# Patient Record
Sex: Male | Born: 1935 | Race: White | Hispanic: No | Marital: Married | Smoking: Former smoker
Health system: Southern US, Community
[De-identification: ages and names within clinical notes are randomized; demographics above are authoritative.]

## PROBLEM LIST (undated history)

## (undated) DIAGNOSIS — Z923 Personal history of irradiation: Secondary | ICD-10-CM

## (undated) DIAGNOSIS — C3410 Malignant neoplasm of upper lobe, unspecified bronchus or lung: Principal | ICD-10-CM

## (undated) DIAGNOSIS — J449 Chronic obstructive pulmonary disease, unspecified: Secondary | ICD-10-CM

## (undated) DIAGNOSIS — C342 Malignant neoplasm of middle lobe, bronchus or lung: Secondary | ICD-10-CM

## (undated) DIAGNOSIS — C3430 Malignant neoplasm of lower lobe, unspecified bronchus or lung: Secondary | ICD-10-CM

## (undated) DIAGNOSIS — I10 Essential (primary) hypertension: Secondary | ICD-10-CM

## (undated) DIAGNOSIS — E119 Type 2 diabetes mellitus without complications: Secondary | ICD-10-CM

## (undated) HISTORY — PX: LUNG SURGERY: SHX703

## (undated) HISTORY — PX: KNEE SURGERY: SHX244

## (undated) HISTORY — DX: Personal history of irradiation: Z92.3

## (undated) HISTORY — DX: Type 2 diabetes mellitus without complications: E11.9

## (undated) HISTORY — DX: Malignant neoplasm of middle lobe, bronchus or lung: C34.2

## (undated) HISTORY — PX: PROSTATE SURGERY: SHX751

## (undated) HISTORY — DX: Malignant neoplasm of upper lobe, unspecified bronchus or lung: C34.10

## (undated) HISTORY — PX: OTHER SURGICAL HISTORY: SHX169

## (undated) HISTORY — DX: Malignant neoplasm of lower lobe, unspecified bronchus or lung: C34.30

## (undated) HISTORY — DX: Chronic obstructive pulmonary disease, unspecified: J44.9

## (undated) HISTORY — DX: Essential (primary) hypertension: I10

## (undated) HISTORY — PX: NEPHRECTOMY: SHX65

---

## 2000-02-24 ENCOUNTER — Encounter: Payer: Self-pay | Admitting: *Deleted

## 2000-02-24 ENCOUNTER — Encounter: Admission: RE | Admit: 2000-02-24 | Discharge: 2000-02-24 | Payer: Self-pay | Admitting: *Deleted

## 2000-03-21 ENCOUNTER — Ambulatory Visit (HOSPITAL_COMMUNITY): Admission: RE | Admit: 2000-03-21 | Discharge: 2000-03-21 | Payer: Self-pay | Admitting: *Deleted

## 2000-05-28 ENCOUNTER — Encounter: Payer: Self-pay | Admitting: *Deleted

## 2000-05-28 ENCOUNTER — Ambulatory Visit (HOSPITAL_COMMUNITY): Admission: RE | Admit: 2000-05-28 | Discharge: 2000-05-28 | Payer: Self-pay | Admitting: *Deleted

## 2000-06-12 ENCOUNTER — Encounter: Payer: Self-pay | Admitting: *Deleted

## 2000-06-12 ENCOUNTER — Ambulatory Visit (HOSPITAL_COMMUNITY): Admission: RE | Admit: 2000-06-12 | Discharge: 2000-06-12 | Payer: Self-pay | Admitting: *Deleted

## 2000-08-06 ENCOUNTER — Ambulatory Visit (HOSPITAL_COMMUNITY): Admission: RE | Admit: 2000-08-06 | Discharge: 2000-08-06 | Payer: Self-pay | Admitting: *Deleted

## 2000-08-06 ENCOUNTER — Encounter: Payer: Self-pay | Admitting: *Deleted

## 2000-09-18 ENCOUNTER — Encounter: Payer: Self-pay | Admitting: Gastroenterology

## 2000-09-18 ENCOUNTER — Ambulatory Visit (HOSPITAL_COMMUNITY): Admission: RE | Admit: 2000-09-18 | Discharge: 2000-09-18 | Payer: Self-pay | Admitting: Gastroenterology

## 2000-10-04 ENCOUNTER — Ambulatory Visit (HOSPITAL_COMMUNITY): Admission: RE | Admit: 2000-10-04 | Discharge: 2000-10-04 | Payer: Self-pay | Admitting: Gastroenterology

## 2000-10-04 ENCOUNTER — Encounter: Payer: Self-pay | Admitting: Gastroenterology

## 2000-10-23 ENCOUNTER — Encounter: Admission: RE | Admit: 2000-10-23 | Discharge: 2000-11-11 | Payer: Self-pay | Admitting: *Deleted

## 2000-10-25 ENCOUNTER — Ambulatory Visit (HOSPITAL_COMMUNITY): Admission: RE | Admit: 2000-10-25 | Discharge: 2000-10-25 | Payer: Self-pay | Admitting: Gastroenterology

## 2000-10-25 ENCOUNTER — Encounter: Payer: Self-pay | Admitting: Gastroenterology

## 2000-12-11 ENCOUNTER — Encounter: Payer: Self-pay | Admitting: *Deleted

## 2000-12-11 ENCOUNTER — Ambulatory Visit (HOSPITAL_COMMUNITY): Admission: RE | Admit: 2000-12-11 | Discharge: 2000-12-11 | Payer: Self-pay | Admitting: *Deleted

## 2001-05-26 ENCOUNTER — Encounter: Admission: RE | Admit: 2001-05-26 | Discharge: 2001-05-26 | Payer: Self-pay | Admitting: *Deleted

## 2001-05-26 ENCOUNTER — Encounter: Payer: Self-pay | Admitting: *Deleted

## 2001-09-24 ENCOUNTER — Encounter: Payer: Self-pay | Admitting: *Deleted

## 2001-09-24 ENCOUNTER — Encounter: Admission: RE | Admit: 2001-09-24 | Discharge: 2001-09-24 | Payer: Self-pay | Admitting: *Deleted

## 2002-06-15 ENCOUNTER — Encounter: Admission: RE | Admit: 2002-06-15 | Discharge: 2002-06-15 | Payer: Self-pay | Admitting: *Deleted

## 2002-06-15 ENCOUNTER — Encounter: Payer: Self-pay | Admitting: *Deleted

## 2002-06-19 ENCOUNTER — Encounter: Admission: RE | Admit: 2002-06-19 | Discharge: 2002-06-19 | Payer: Self-pay | Admitting: *Deleted

## 2002-06-19 ENCOUNTER — Encounter: Payer: Self-pay | Admitting: *Deleted

## 2002-07-21 ENCOUNTER — Encounter: Payer: Self-pay | Admitting: Neurosurgery

## 2002-07-23 ENCOUNTER — Inpatient Hospital Stay (HOSPITAL_COMMUNITY): Admission: RE | Admit: 2002-07-23 | Discharge: 2002-07-25 | Payer: Self-pay | Admitting: Neurosurgery

## 2002-07-23 ENCOUNTER — Encounter: Payer: Self-pay | Admitting: Neurosurgery

## 2002-09-28 ENCOUNTER — Encounter: Payer: Self-pay | Admitting: Gastroenterology

## 2002-09-28 ENCOUNTER — Ambulatory Visit (HOSPITAL_COMMUNITY): Admission: RE | Admit: 2002-09-28 | Discharge: 2002-09-28 | Payer: Self-pay | Admitting: Gastroenterology

## 2002-12-01 ENCOUNTER — Encounter: Payer: Self-pay | Admitting: Neurosurgery

## 2002-12-03 ENCOUNTER — Encounter: Payer: Self-pay | Admitting: Neurosurgery

## 2002-12-03 ENCOUNTER — Inpatient Hospital Stay (HOSPITAL_COMMUNITY): Admission: RE | Admit: 2002-12-03 | Discharge: 2002-12-05 | Payer: Self-pay | Admitting: Neurosurgery

## 2003-07-23 ENCOUNTER — Ambulatory Visit (HOSPITAL_COMMUNITY): Admission: RE | Admit: 2003-07-23 | Discharge: 2003-07-23 | Payer: Self-pay | Admitting: Neurosurgery

## 2003-08-04 ENCOUNTER — Ambulatory Visit (HOSPITAL_COMMUNITY): Admission: RE | Admit: 2003-08-04 | Discharge: 2003-08-04 | Payer: Self-pay | Admitting: Neurosurgery

## 2003-08-18 ENCOUNTER — Inpatient Hospital Stay (HOSPITAL_COMMUNITY): Admission: RE | Admit: 2003-08-18 | Discharge: 2003-08-19 | Payer: Self-pay | Admitting: Neurosurgery

## 2003-10-06 ENCOUNTER — Encounter (INDEPENDENT_AMBULATORY_CARE_PROVIDER_SITE_OTHER): Payer: Self-pay | Admitting: Specialist

## 2003-10-06 ENCOUNTER — Ambulatory Visit (HOSPITAL_COMMUNITY): Admission: RE | Admit: 2003-10-06 | Discharge: 2003-10-06 | Payer: Self-pay | Admitting: Thoracic Surgery

## 2003-10-18 ENCOUNTER — Encounter (INDEPENDENT_AMBULATORY_CARE_PROVIDER_SITE_OTHER): Payer: Self-pay | Admitting: *Deleted

## 2003-10-18 ENCOUNTER — Inpatient Hospital Stay (HOSPITAL_COMMUNITY): Admission: EM | Admit: 2003-10-18 | Discharge: 2003-10-26 | Payer: Self-pay | Admitting: Thoracic Surgery

## 2003-11-02 ENCOUNTER — Encounter: Admission: RE | Admit: 2003-11-02 | Discharge: 2003-11-02 | Payer: Self-pay | Admitting: Thoracic Surgery

## 2003-11-16 ENCOUNTER — Encounter: Admission: RE | Admit: 2003-11-16 | Discharge: 2003-11-16 | Payer: Self-pay | Admitting: Thoracic Surgery

## 2003-12-22 ENCOUNTER — Ambulatory Visit (HOSPITAL_COMMUNITY): Admission: RE | Admit: 2003-12-22 | Discharge: 2003-12-22 | Payer: Self-pay | Admitting: Oncology

## 2003-12-23 ENCOUNTER — Encounter: Admission: RE | Admit: 2003-12-23 | Discharge: 2003-12-23 | Payer: Self-pay | Admitting: Thoracic Surgery

## 2004-01-20 ENCOUNTER — Encounter: Admission: RE | Admit: 2004-01-20 | Discharge: 2004-01-20 | Payer: Self-pay | Admitting: Thoracic Surgery

## 2004-01-26 ENCOUNTER — Ambulatory Visit (HOSPITAL_COMMUNITY): Admission: RE | Admit: 2004-01-26 | Discharge: 2004-01-26 | Payer: Self-pay | Admitting: Neurosurgery

## 2004-03-01 ENCOUNTER — Encounter: Admission: RE | Admit: 2004-03-01 | Discharge: 2004-03-01 | Payer: Self-pay | Admitting: Neurosurgery

## 2004-03-17 ENCOUNTER — Encounter: Admission: RE | Admit: 2004-03-17 | Discharge: 2004-03-17 | Payer: Self-pay | Admitting: Neurosurgery

## 2004-03-21 ENCOUNTER — Encounter: Admission: RE | Admit: 2004-03-21 | Discharge: 2004-03-21 | Payer: Self-pay | Admitting: Thoracic Surgery

## 2004-04-04 ENCOUNTER — Encounter: Admission: RE | Admit: 2004-04-04 | Discharge: 2004-04-04 | Payer: Self-pay | Admitting: Neurosurgery

## 2004-04-19 ENCOUNTER — Ambulatory Visit (HOSPITAL_COMMUNITY): Admission: RE | Admit: 2004-04-19 | Discharge: 2004-04-19 | Payer: Self-pay | Admitting: Oncology

## 2004-06-14 ENCOUNTER — Encounter: Admission: RE | Admit: 2004-06-14 | Discharge: 2004-06-14 | Payer: Self-pay | Admitting: Thoracic Surgery

## 2004-08-15 ENCOUNTER — Ambulatory Visit: Payer: Self-pay | Admitting: Oncology

## 2004-08-22 ENCOUNTER — Ambulatory Visit (HOSPITAL_COMMUNITY): Admission: RE | Admit: 2004-08-22 | Discharge: 2004-08-22 | Payer: Self-pay | Admitting: Oncology

## 2004-10-18 ENCOUNTER — Encounter: Admission: RE | Admit: 2004-10-18 | Discharge: 2004-10-18 | Payer: Self-pay | Admitting: Thoracic Surgery

## 2004-11-07 ENCOUNTER — Ambulatory Visit: Payer: Self-pay | Admitting: Oncology

## 2004-11-07 ENCOUNTER — Ambulatory Visit (HOSPITAL_COMMUNITY): Admission: RE | Admit: 2004-11-07 | Discharge: 2004-11-07 | Payer: Self-pay | Admitting: Oncology

## 2005-02-28 ENCOUNTER — Ambulatory Visit: Payer: Self-pay | Admitting: Gastroenterology

## 2005-03-01 ENCOUNTER — Ambulatory Visit: Payer: Self-pay | Admitting: Gastroenterology

## 2005-03-01 ENCOUNTER — Ambulatory Visit (HOSPITAL_COMMUNITY): Admission: RE | Admit: 2005-03-01 | Discharge: 2005-03-01 | Payer: Self-pay | Admitting: Gastroenterology

## 2005-03-05 ENCOUNTER — Ambulatory Visit: Payer: Self-pay | Admitting: Oncology

## 2005-03-08 ENCOUNTER — Ambulatory Visit (HOSPITAL_COMMUNITY): Admission: RE | Admit: 2005-03-08 | Discharge: 2005-03-08 | Payer: Self-pay | Admitting: Oncology

## 2005-03-27 ENCOUNTER — Ambulatory Visit (HOSPITAL_COMMUNITY): Admission: RE | Admit: 2005-03-27 | Discharge: 2005-03-27 | Payer: Self-pay | Admitting: Gastroenterology

## 2005-03-30 ENCOUNTER — Ambulatory Visit: Payer: Self-pay | Admitting: Gastroenterology

## 2005-03-30 ENCOUNTER — Ambulatory Visit: Payer: Self-pay | Admitting: Cardiology

## 2005-03-30 ENCOUNTER — Inpatient Hospital Stay (HOSPITAL_COMMUNITY): Admission: AD | Admit: 2005-03-30 | Discharge: 2005-04-05 | Payer: Self-pay | Admitting: Gastroenterology

## 2005-04-13 ENCOUNTER — Ambulatory Visit: Payer: Self-pay | Admitting: Gastroenterology

## 2005-04-18 ENCOUNTER — Encounter: Admission: RE | Admit: 2005-04-18 | Discharge: 2005-04-18 | Payer: Self-pay | Admitting: Thoracic Surgery

## 2005-06-21 ENCOUNTER — Encounter: Admission: RE | Admit: 2005-06-21 | Discharge: 2005-06-21 | Payer: Self-pay | Admitting: Neurosurgery

## 2005-07-05 ENCOUNTER — Encounter: Admission: RE | Admit: 2005-07-05 | Discharge: 2005-07-05 | Payer: Self-pay | Admitting: Neurosurgery

## 2005-07-11 ENCOUNTER — Ambulatory Visit: Payer: Self-pay | Admitting: Gastroenterology

## 2005-08-07 ENCOUNTER — Encounter: Admission: RE | Admit: 2005-08-07 | Discharge: 2005-08-07 | Payer: Self-pay | Admitting: Neurosurgery

## 2005-08-28 ENCOUNTER — Ambulatory Visit: Payer: Self-pay | Admitting: Oncology

## 2005-08-30 ENCOUNTER — Ambulatory Visit (HOSPITAL_COMMUNITY): Admission: RE | Admit: 2005-08-30 | Discharge: 2005-08-30 | Payer: Self-pay | Admitting: Oncology

## 2005-10-24 ENCOUNTER — Encounter: Admission: RE | Admit: 2005-10-24 | Discharge: 2005-10-24 | Payer: Self-pay | Admitting: Thoracic Surgery

## 2005-11-23 ENCOUNTER — Ambulatory Visit (HOSPITAL_COMMUNITY): Admission: RE | Admit: 2005-11-23 | Discharge: 2005-11-23 | Payer: Self-pay | Admitting: Oncology

## 2005-11-30 ENCOUNTER — Ambulatory Visit: Payer: Self-pay | Admitting: Oncology

## 2006-03-06 ENCOUNTER — Encounter: Admission: RE | Admit: 2006-03-06 | Discharge: 2006-03-06 | Payer: Self-pay | Admitting: Thoracic Surgery

## 2006-03-15 ENCOUNTER — Ambulatory Visit: Payer: Self-pay | Admitting: Oncology

## 2006-03-19 ENCOUNTER — Encounter: Admission: RE | Admit: 2006-03-19 | Discharge: 2006-03-19 | Payer: Self-pay | Admitting: Family Medicine

## 2006-03-25 LAB — CBC WITH DIFFERENTIAL/PLATELET
Basophils Absolute: 0 10*3/uL (ref 0.0–0.1)
Eosinophils Absolute: 0.2 10*3/uL (ref 0.0–0.5)
HCT: 44.9 % (ref 38.7–49.9)
LYMPH%: 21.8 % (ref 14.0–48.0)
MCV: 85 fL (ref 81.6–98.0)
MONO#: 0.7 10*3/uL (ref 0.1–0.9)
MONO%: 10.4 % (ref 0.0–13.0)
NEUT#: 4.1 10*3/uL (ref 1.5–6.5)
NEUT%: 64.4 % (ref 40.0–75.0)
Platelets: 247 10*3/uL (ref 145–400)
RBC: 5.28 10*6/uL (ref 4.20–5.71)
WBC: 6.4 10*3/uL (ref 4.0–10.0)

## 2006-03-25 LAB — COMPREHENSIVE METABOLIC PANEL
Albumin: 4.2 g/dL (ref 3.5–5.2)
Alkaline Phosphatase: 53 U/L (ref 39–117)
BUN: 11 mg/dL (ref 6–23)
CO2: 30 mEq/L (ref 19–32)
Calcium: 9.6 mg/dL (ref 8.4–10.5)
Chloride: 94 mEq/L — ABNORMAL LOW (ref 96–112)
Glucose, Bld: 118 mg/dL — ABNORMAL HIGH (ref 70–99)
Potassium: 3.9 mEq/L (ref 3.5–5.3)
Sodium: 132 mEq/L — ABNORMAL LOW (ref 135–145)
Total Protein: 6.9 g/dL (ref 6.0–8.3)

## 2006-03-25 LAB — MORPHOLOGY: RBC Comments: NORMAL

## 2006-03-25 LAB — LACTATE DEHYDROGENASE: LDH: 140 U/L (ref 94–250)

## 2006-03-27 ENCOUNTER — Ambulatory Visit (HOSPITAL_COMMUNITY): Admission: RE | Admit: 2006-03-27 | Discharge: 2006-03-27 | Payer: Self-pay | Admitting: Oncology

## 2006-09-11 ENCOUNTER — Encounter: Admission: RE | Admit: 2006-09-11 | Discharge: 2006-09-11 | Payer: Self-pay | Admitting: Thoracic Surgery

## 2006-09-18 ENCOUNTER — Ambulatory Visit: Payer: Self-pay | Admitting: Oncology

## 2006-09-23 LAB — CBC WITH DIFFERENTIAL/PLATELET
BASO%: 0.6 % (ref 0.0–2.0)
Eosinophils Absolute: 0.3 10*3/uL (ref 0.0–0.5)
LYMPH%: 24.3 % (ref 14.0–48.0)
MCHC: 34.8 g/dL (ref 32.0–35.9)
MONO#: 0.6 10*3/uL (ref 0.1–0.9)
NEUT#: 3.7 10*3/uL (ref 1.5–6.5)
Platelets: 223 10*3/uL (ref 145–400)
RBC: 5.33 10*6/uL (ref 4.20–5.71)
RDW: 12.5 % (ref 11.2–14.6)
WBC: 6.1 10*3/uL (ref 4.0–10.0)

## 2006-09-23 LAB — LACTATE DEHYDROGENASE: LDH: 120 U/L (ref 94–250)

## 2006-09-23 LAB — COMPREHENSIVE METABOLIC PANEL
Albumin: 4 g/dL (ref 3.5–5.2)
Alkaline Phosphatase: 52 U/L (ref 39–117)
BUN: 15 mg/dL (ref 6–23)
CO2: 30 mEq/L (ref 19–32)
Glucose, Bld: 157 mg/dL — ABNORMAL HIGH (ref 70–99)
Sodium: 135 mEq/L (ref 135–145)
Total Bilirubin: 0.8 mg/dL (ref 0.3–1.2)
Total Protein: 6.9 g/dL (ref 6.0–8.3)

## 2006-09-30 ENCOUNTER — Ambulatory Visit (HOSPITAL_COMMUNITY): Admission: RE | Admit: 2006-09-30 | Discharge: 2006-09-30 | Payer: Self-pay | Admitting: Oncology

## 2007-02-19 ENCOUNTER — Encounter: Admission: RE | Admit: 2007-02-19 | Discharge: 2007-02-19 | Payer: Self-pay | Admitting: Thoracic Surgery

## 2007-02-19 ENCOUNTER — Ambulatory Visit: Payer: Self-pay | Admitting: Thoracic Surgery

## 2007-03-27 ENCOUNTER — Ambulatory Visit: Payer: Self-pay | Admitting: Oncology

## 2007-03-31 LAB — LACTATE DEHYDROGENASE: LDH: 136 U/L (ref 94–250)

## 2007-03-31 LAB — COMPREHENSIVE METABOLIC PANEL
ALT: 17 U/L (ref 0–53)
Albumin: 3.9 g/dL (ref 3.5–5.2)
CO2: 30 mEq/L (ref 19–32)
Calcium: 9.2 mg/dL (ref 8.4–10.5)
Chloride: 94 mEq/L — ABNORMAL LOW (ref 96–112)
Glucose, Bld: 104 mg/dL — ABNORMAL HIGH (ref 70–99)
Sodium: 133 mEq/L — ABNORMAL LOW (ref 135–145)
Total Protein: 6.5 g/dL (ref 6.0–8.3)

## 2007-03-31 LAB — CBC WITH DIFFERENTIAL/PLATELET
BASO%: 0.3 % (ref 0.0–2.0)
Eosinophils Absolute: 0.2 10*3/uL (ref 0.0–0.5)
HCT: 44.3 % (ref 38.7–49.9)
MCHC: 35.9 g/dL (ref 32.0–35.9)
MONO#: 0.6 10*3/uL (ref 0.1–0.9)
NEUT#: 3.9 10*3/uL (ref 1.5–6.5)
Platelets: 211 10*3/uL (ref 145–400)
RBC: 5.09 10*6/uL (ref 4.20–5.71)
WBC: 6.5 10*3/uL (ref 4.0–10.0)
lymph#: 1.7 10*3/uL (ref 0.9–3.3)

## 2007-04-03 ENCOUNTER — Ambulatory Visit (HOSPITAL_COMMUNITY): Admission: RE | Admit: 2007-04-03 | Discharge: 2007-04-03 | Payer: Self-pay | Admitting: Oncology

## 2007-09-26 ENCOUNTER — Ambulatory Visit: Payer: Self-pay | Admitting: Oncology

## 2007-09-30 ENCOUNTER — Ambulatory Visit (HOSPITAL_COMMUNITY): Admission: RE | Admit: 2007-09-30 | Discharge: 2007-09-30 | Payer: Self-pay | Admitting: Oncology

## 2007-09-30 LAB — CBC WITH DIFFERENTIAL/PLATELET
Basophils Absolute: 0 10*3/uL (ref 0.0–0.1)
Eosinophils Absolute: 0.2 10*3/uL (ref 0.0–0.5)
HCT: 46.9 % (ref 38.7–49.9)
HGB: 16.6 g/dL (ref 13.0–17.1)
MCV: 87.8 fL (ref 81.6–98.0)
MONO%: 11.1 % (ref 0.0–13.0)
NEUT#: 3.7 10*3/uL (ref 1.5–6.5)
NEUT%: 60.6 % (ref 40.0–75.0)
Platelets: 219 10*3/uL (ref 145–400)
RDW: 13.2 % (ref 11.2–14.6)

## 2007-09-30 LAB — COMPREHENSIVE METABOLIC PANEL
Albumin: 3.7 g/dL (ref 3.5–5.2)
Alkaline Phosphatase: 50 U/L (ref 39–117)
BUN: 7 mg/dL (ref 6–23)
Calcium: 9.4 mg/dL (ref 8.4–10.5)
Chloride: 94 mEq/L — ABNORMAL LOW (ref 96–112)
Glucose, Bld: 110 mg/dL — ABNORMAL HIGH (ref 70–99)
Potassium: 4.2 mEq/L (ref 3.5–5.3)

## 2008-01-23 ENCOUNTER — Ambulatory Visit: Payer: Self-pay | Admitting: Oncology

## 2008-01-27 ENCOUNTER — Ambulatory Visit (HOSPITAL_COMMUNITY): Admission: RE | Admit: 2008-01-27 | Discharge: 2008-01-27 | Payer: Self-pay | Admitting: Oncology

## 2008-01-27 LAB — BASIC METABOLIC PANEL
BUN: 5 mg/dL — ABNORMAL LOW (ref 6–23)
Chloride: 88 mEq/L — ABNORMAL LOW (ref 96–112)
Potassium: 3.4 mEq/L — ABNORMAL LOW (ref 3.5–5.3)
Sodium: 128 mEq/L — ABNORMAL LOW (ref 135–145)

## 2008-02-03 ENCOUNTER — Encounter: Payer: Self-pay | Admitting: Gastroenterology

## 2008-02-05 ENCOUNTER — Ambulatory Visit (HOSPITAL_COMMUNITY): Admission: RE | Admit: 2008-02-05 | Discharge: 2008-02-05 | Payer: Self-pay | Admitting: Oncology

## 2008-02-10 ENCOUNTER — Ambulatory Visit: Payer: Self-pay | Admitting: Thoracic Surgery

## 2008-02-24 ENCOUNTER — Encounter: Payer: Self-pay | Admitting: Thoracic Surgery

## 2008-02-24 ENCOUNTER — Inpatient Hospital Stay (HOSPITAL_COMMUNITY): Admission: RE | Admit: 2008-02-24 | Discharge: 2008-03-03 | Payer: Self-pay | Admitting: Thoracic Surgery

## 2008-02-24 ENCOUNTER — Ambulatory Visit: Payer: Self-pay | Admitting: Thoracic Surgery

## 2008-03-07 ENCOUNTER — Ambulatory Visit: Payer: Self-pay | Admitting: Oncology

## 2008-03-07 ENCOUNTER — Inpatient Hospital Stay (HOSPITAL_COMMUNITY): Admission: EM | Admit: 2008-03-07 | Discharge: 2008-03-17 | Payer: Self-pay | Admitting: Thoracic Surgery

## 2008-03-07 ENCOUNTER — Ambulatory Visit: Payer: Self-pay | Admitting: Emergency Medicine

## 2008-03-07 ENCOUNTER — Encounter: Payer: Self-pay | Admitting: Emergency Medicine

## 2008-03-12 ENCOUNTER — Ambulatory Visit: Payer: Self-pay | Admitting: Oncology

## 2008-03-23 ENCOUNTER — Encounter: Admission: RE | Admit: 2008-03-23 | Discharge: 2008-03-23 | Payer: Self-pay | Admitting: Thoracic Surgery

## 2008-03-23 ENCOUNTER — Ambulatory Visit: Payer: Self-pay | Admitting: Thoracic Surgery

## 2008-03-25 ENCOUNTER — Ambulatory Visit: Payer: Self-pay | Admitting: Thoracic Surgery

## 2008-03-25 ENCOUNTER — Encounter: Admission: RE | Admit: 2008-03-25 | Discharge: 2008-03-25 | Payer: Self-pay | Admitting: Thoracic Surgery

## 2008-03-30 ENCOUNTER — Ambulatory Visit: Payer: Self-pay | Admitting: Thoracic Surgery

## 2008-03-30 ENCOUNTER — Encounter: Admission: RE | Admit: 2008-03-30 | Discharge: 2008-03-30 | Payer: Self-pay | Admitting: Thoracic Surgery

## 2008-04-07 LAB — CBC WITH DIFFERENTIAL/PLATELET
Basophils Absolute: 0 10*3/uL (ref 0.0–0.1)
Eosinophils Absolute: 0.5 10*3/uL (ref 0.0–0.5)
HGB: 11.6 g/dL — ABNORMAL LOW (ref 13.0–17.1)
MCV: 83.6 fL (ref 81.6–98.0)
MONO#: 0.9 10*3/uL (ref 0.1–0.9)
MONO%: 10.1 % (ref 0.0–13.0)
NEUT#: 6.3 10*3/uL (ref 1.5–6.5)
RBC: 3.96 10*6/uL — ABNORMAL LOW (ref 4.20–5.71)
RDW: 14.2 % (ref 11.2–14.6)
WBC: 9.3 10*3/uL (ref 4.0–10.0)
lymph#: 1.5 10*3/uL (ref 0.9–3.3)

## 2008-04-07 LAB — COMPREHENSIVE METABOLIC PANEL
Albumin: 3.5 g/dL (ref 3.5–5.2)
Alkaline Phosphatase: 81 U/L (ref 39–117)
Calcium: 9.3 mg/dL (ref 8.4–10.5)
Chloride: 96 mEq/L (ref 96–112)
Glucose, Bld: 95 mg/dL (ref 70–99)
Potassium: 4.6 mEq/L (ref 3.5–5.3)
Sodium: 133 mEq/L — ABNORMAL LOW (ref 135–145)
Total Protein: 7.3 g/dL (ref 6.0–8.3)

## 2008-04-13 ENCOUNTER — Ambulatory Visit: Payer: Self-pay | Admitting: Thoracic Surgery

## 2008-04-13 ENCOUNTER — Encounter: Admission: RE | Admit: 2008-04-13 | Discharge: 2008-04-13 | Payer: Self-pay | Admitting: Thoracic Surgery

## 2008-05-25 ENCOUNTER — Ambulatory Visit: Payer: Self-pay | Admitting: Thoracic Surgery

## 2008-05-25 ENCOUNTER — Encounter: Admission: RE | Admit: 2008-05-25 | Discharge: 2008-05-25 | Payer: Self-pay | Admitting: Thoracic Surgery

## 2008-07-01 ENCOUNTER — Ambulatory Visit: Payer: Self-pay | Admitting: Oncology

## 2008-07-05 ENCOUNTER — Ambulatory Visit (HOSPITAL_COMMUNITY): Admission: RE | Admit: 2008-07-05 | Discharge: 2008-07-05 | Payer: Self-pay | Admitting: Oncology

## 2008-07-05 LAB — CBC WITH DIFFERENTIAL/PLATELET
Basophils Absolute: 0 10*3/uL (ref 0.0–0.1)
Eosinophils Absolute: 0.3 10*3/uL (ref 0.0–0.5)
HGB: 13.8 g/dL (ref 13.0–17.1)
MCV: 82.1 fL (ref 81.6–98.0)
MONO#: 0.6 10*3/uL (ref 0.1–0.9)
MONO%: 9.6 % (ref 0.0–13.0)
NEUT#: 3.4 10*3/uL (ref 1.5–6.5)
Platelets: 210 10*3/uL (ref 145–400)
RBC: 4.73 10*6/uL (ref 4.20–5.71)
RDW: 14.1 % (ref 11.2–14.6)
WBC: 6 10*3/uL (ref 4.0–10.0)

## 2008-07-05 LAB — COMPREHENSIVE METABOLIC PANEL
Albumin: 3.9 g/dL (ref 3.5–5.2)
BUN: 13 mg/dL (ref 6–23)
CO2: 25 mEq/L (ref 19–32)
Calcium: 9.5 mg/dL (ref 8.4–10.5)
Glucose, Bld: 110 mg/dL — ABNORMAL HIGH (ref 70–99)
Potassium: 4.2 mEq/L (ref 3.5–5.3)
Sodium: 137 mEq/L (ref 135–145)
Total Protein: 6.6 g/dL (ref 6.0–8.3)

## 2008-07-28 ENCOUNTER — Encounter: Admission: RE | Admit: 2008-07-28 | Discharge: 2008-07-28 | Payer: Self-pay | Admitting: Thoracic Surgery

## 2008-07-28 ENCOUNTER — Ambulatory Visit: Payer: Self-pay | Admitting: Thoracic Surgery

## 2008-09-27 ENCOUNTER — Ambulatory Visit: Payer: Self-pay | Admitting: Oncology

## 2008-09-29 ENCOUNTER — Ambulatory Visit (HOSPITAL_COMMUNITY): Admission: RE | Admit: 2008-09-29 | Discharge: 2008-09-29 | Payer: Self-pay | Admitting: Oncology

## 2008-09-29 LAB — COMPREHENSIVE METABOLIC PANEL
Alkaline Phosphatase: 57 U/L (ref 39–117)
BUN: 13 mg/dL (ref 6–23)
CO2: 31 mEq/L (ref 19–32)
Creatinine, Ser: 1.05 mg/dL (ref 0.40–1.50)
Glucose, Bld: 109 mg/dL — ABNORMAL HIGH (ref 70–99)
Total Bilirubin: 1 mg/dL (ref 0.3–1.2)

## 2008-09-29 LAB — CBC WITH DIFFERENTIAL/PLATELET
Eosinophils Absolute: 0.3 10*3/uL (ref 0.0–0.5)
HCT: 45.1 % (ref 38.7–49.9)
LYMPH%: 24.8 % (ref 14.0–48.0)
MCV: 85.5 fL (ref 81.6–98.0)
MONO#: 0.7 10*3/uL (ref 0.1–0.9)
MONO%: 8.4 % (ref 0.0–13.0)
NEUT#: 5 10*3/uL (ref 1.5–6.5)
NEUT%: 63.2 % (ref 40.0–75.0)
Platelets: 233 10*3/uL (ref 145–400)
WBC: 7.9 10*3/uL (ref 4.0–10.0)

## 2008-09-29 LAB — LACTATE DEHYDROGENASE: LDH: 122 U/L (ref 94–250)

## 2008-10-06 ENCOUNTER — Ambulatory Visit: Payer: Self-pay | Admitting: Thoracic Surgery

## 2008-10-06 ENCOUNTER — Encounter: Payer: Self-pay | Admitting: Gastroenterology

## 2009-02-15 ENCOUNTER — Encounter: Admission: RE | Admit: 2009-02-15 | Discharge: 2009-02-15 | Payer: Self-pay | Admitting: Thoracic Surgery

## 2009-02-15 ENCOUNTER — Ambulatory Visit: Payer: Self-pay | Admitting: Thoracic Surgery

## 2009-03-25 ENCOUNTER — Ambulatory Visit: Payer: Self-pay | Admitting: Oncology

## 2009-03-29 ENCOUNTER — Ambulatory Visit (HOSPITAL_COMMUNITY): Admission: RE | Admit: 2009-03-29 | Discharge: 2009-03-29 | Payer: Self-pay | Admitting: Oncology

## 2009-03-29 LAB — CBC WITH DIFFERENTIAL/PLATELET
EOS%: 4.7 % (ref 0.0–7.0)
Eosinophils Absolute: 0.3 10*3/uL (ref 0.0–0.5)
LYMPH%: 30.8 % (ref 14.0–49.0)
MCH: 30.6 pg (ref 27.2–33.4)
MCV: 86.9 fL (ref 79.3–98.0)
MONO%: 11.6 % (ref 0.0–14.0)
Platelets: 190 10*3/uL (ref 140–400)
RBC: 5.03 10*6/uL (ref 4.20–5.82)
RDW: 13 % (ref 11.0–14.6)

## 2009-03-29 LAB — COMPREHENSIVE METABOLIC PANEL
AST: 24 U/L (ref 0–37)
Albumin: 3.6 g/dL (ref 3.5–5.2)
Alkaline Phosphatase: 45 U/L (ref 39–117)
BUN: 13 mg/dL (ref 6–23)
Glucose, Bld: 113 mg/dL — ABNORMAL HIGH (ref 70–99)
Potassium: 4.4 mEq/L (ref 3.5–5.3)
Sodium: 139 mEq/L (ref 135–145)
Total Bilirubin: 0.9 mg/dL (ref 0.3–1.2)
Total Protein: 6.6 g/dL (ref 6.0–8.3)

## 2009-04-06 ENCOUNTER — Ambulatory Visit: Payer: Self-pay | Admitting: Thoracic Surgery

## 2009-09-26 ENCOUNTER — Ambulatory Visit: Payer: Self-pay | Admitting: Oncology

## 2009-09-28 ENCOUNTER — Ambulatory Visit (HOSPITAL_COMMUNITY): Admission: RE | Admit: 2009-09-28 | Discharge: 2009-09-28 | Payer: Self-pay | Admitting: Oncology

## 2009-09-28 LAB — CBC WITH DIFFERENTIAL/PLATELET
Basophils Absolute: 0 10*3/uL (ref 0.0–0.1)
EOS%: 2.4 % (ref 0.0–7.0)
HCT: 46.2 % (ref 38.4–49.9)
HGB: 15.9 g/dL (ref 13.0–17.1)
LYMPH%: 27.1 % (ref 14.0–49.0)
MCH: 31 pg (ref 27.2–33.4)
MCHC: 34.4 g/dL (ref 32.0–36.0)
MCV: 90.1 fL (ref 79.3–98.0)
MONO%: 9.2 % (ref 0.0–14.0)
NEUT%: 61 % (ref 39.0–75.0)
Platelets: 205 10*3/uL (ref 140–400)
lymph#: 1.8 10*3/uL (ref 0.9–3.3)

## 2009-09-28 LAB — COMPREHENSIVE METABOLIC PANEL
AST: 22 U/L (ref 0–37)
BUN: 9 mg/dL (ref 6–23)
Calcium: 9 mg/dL (ref 8.4–10.5)
Chloride: 102 mEq/L (ref 96–112)
Creatinine, Ser: 1.1 mg/dL (ref 0.40–1.50)
Total Bilirubin: 0.8 mg/dL (ref 0.3–1.2)

## 2009-10-05 ENCOUNTER — Ambulatory Visit: Payer: Self-pay | Admitting: Thoracic Surgery

## 2009-11-07 IMAGING — CR DG CHEST 1V PORT
1 series · 1 of 1 positions shown · non-contrast
Comparison: 02/24/2008

CLINICAL DATA: Thoracotomy for right lower lobe mass.

PORTABLE CHEST - 1 VIEW

[view not recorded]
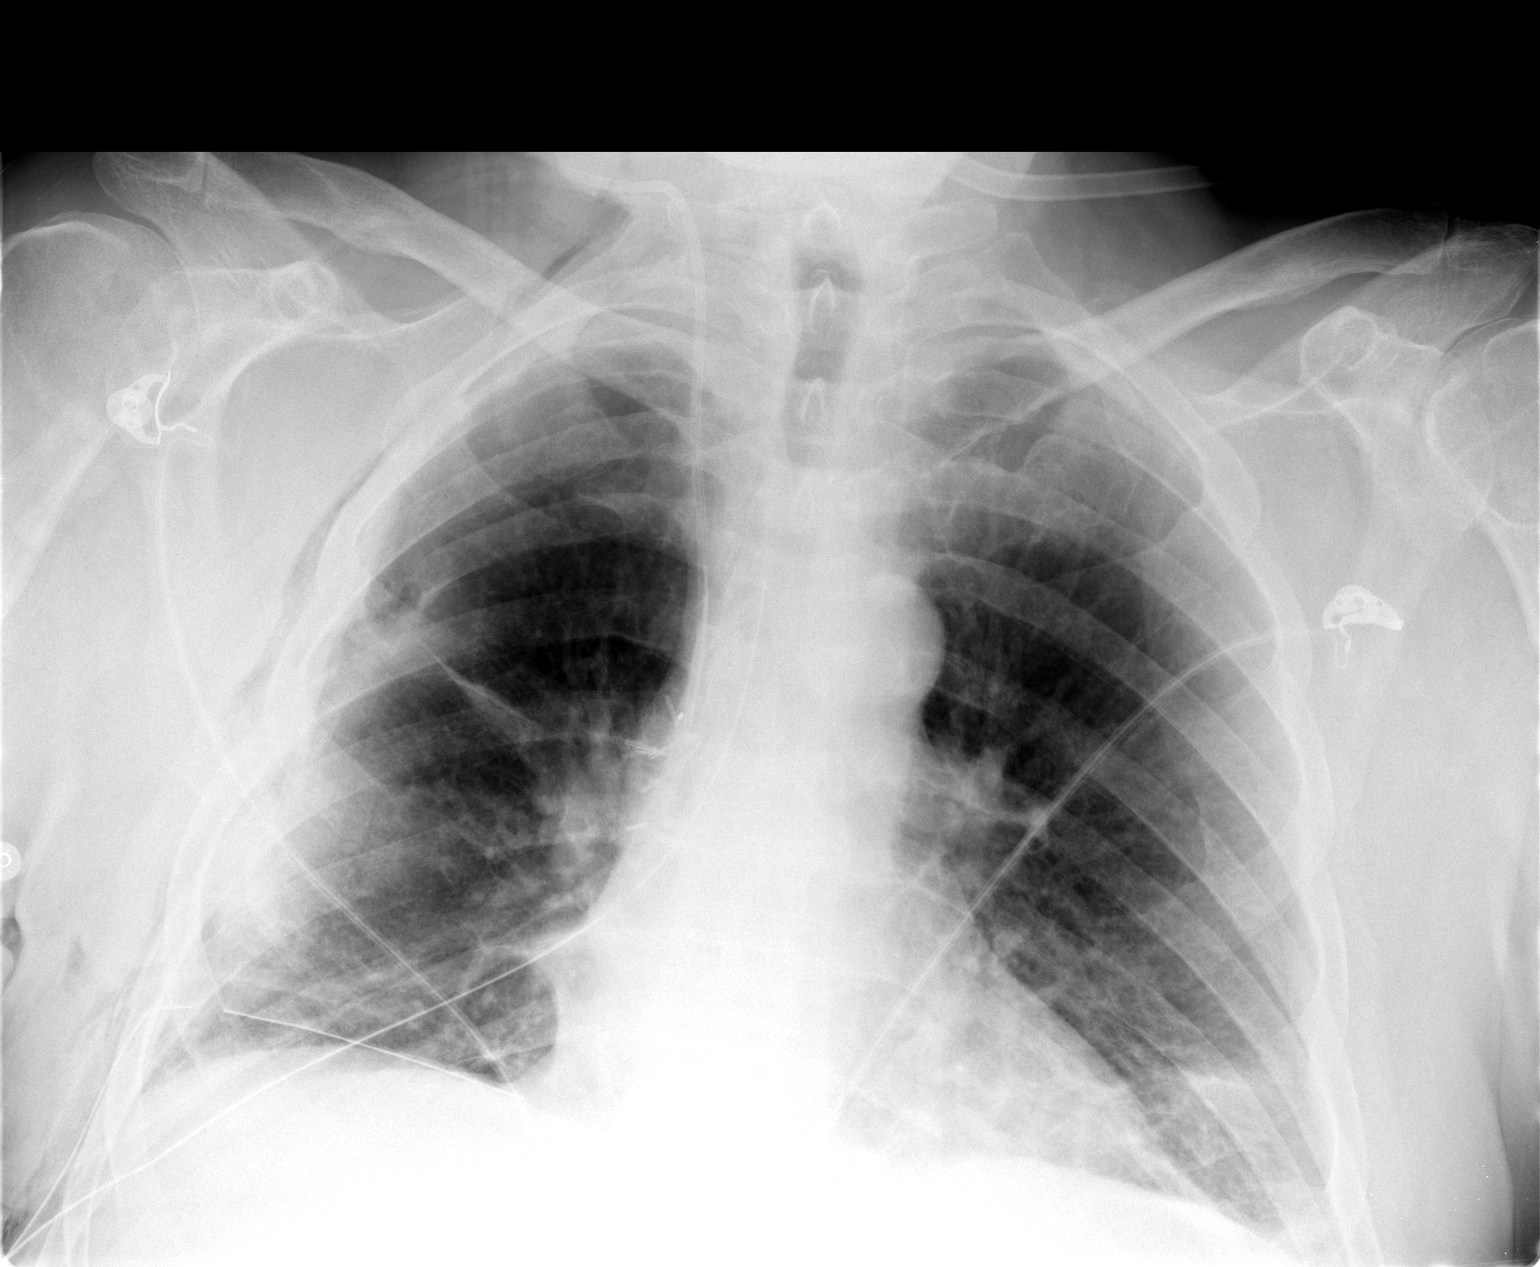

[1 of 1 positions shown; findings below may reference images not displayed]

FINDINGS: There has  been a right-sided thoracotomy.  Two chest
tubes are  present on the right.  There is a 5-10% pneumothorax on
the right in the lateral chest.  There is no significant effusion.
Patchy bibasilar atelectasis is present.

Central line tip is in the SVC.
IMPRESSION: 5-10% pneumothorax on the right following thoracotomy

Bibasilar atelectasis.

## 2009-11-08 IMAGING — CR DG CHEST 1V PORT
1 series · 1 of 1 positions shown · non-contrast
Comparison: Portable chest of 02/24/2008

CLINICAL DATA: Follow up VATS for right lung mass

PORTABLE CHEST - 1 VIEW

[view not recorded]
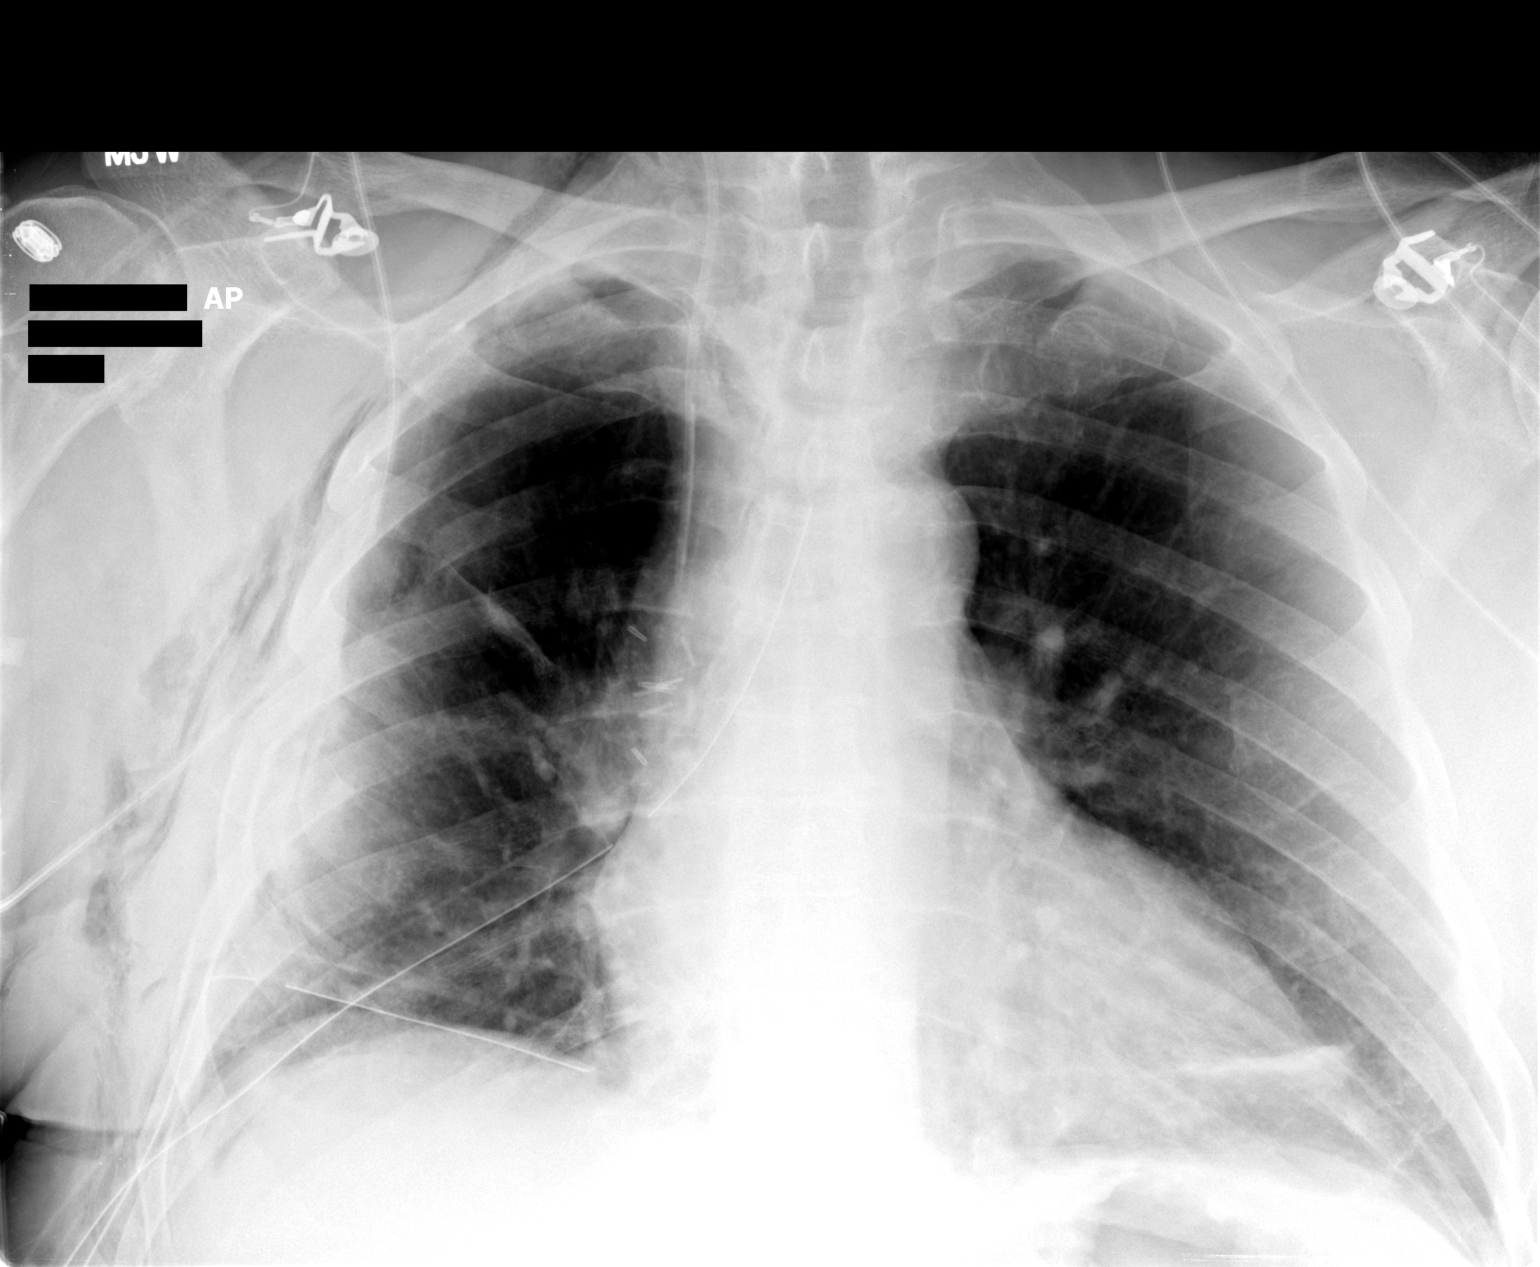

[1 of 1 positions shown; findings below may reference images not displayed]

FINDINGS: Two right chest tubes and a right central venous line
remain.  No definite pneumothorax is seen.  Right chest wall
subcutaneous air remains.  Linear atelectasis is noted at the left
lung base.
IMPRESSION: Two right chest tubes remain.  No definite pneumothorax.  No change
in right chest wall subcutaneous air.

## 2009-11-09 IMAGING — CR DG CHEST 1V PORT
1 series · 1 of 1 positions shown · non-contrast
Comparison: 02/25/2008.

CLINICAL DATA: Right lower lobe mass.  Postop VATS.

PORTABLE CHEST - 1 VIEW

[view not recorded]
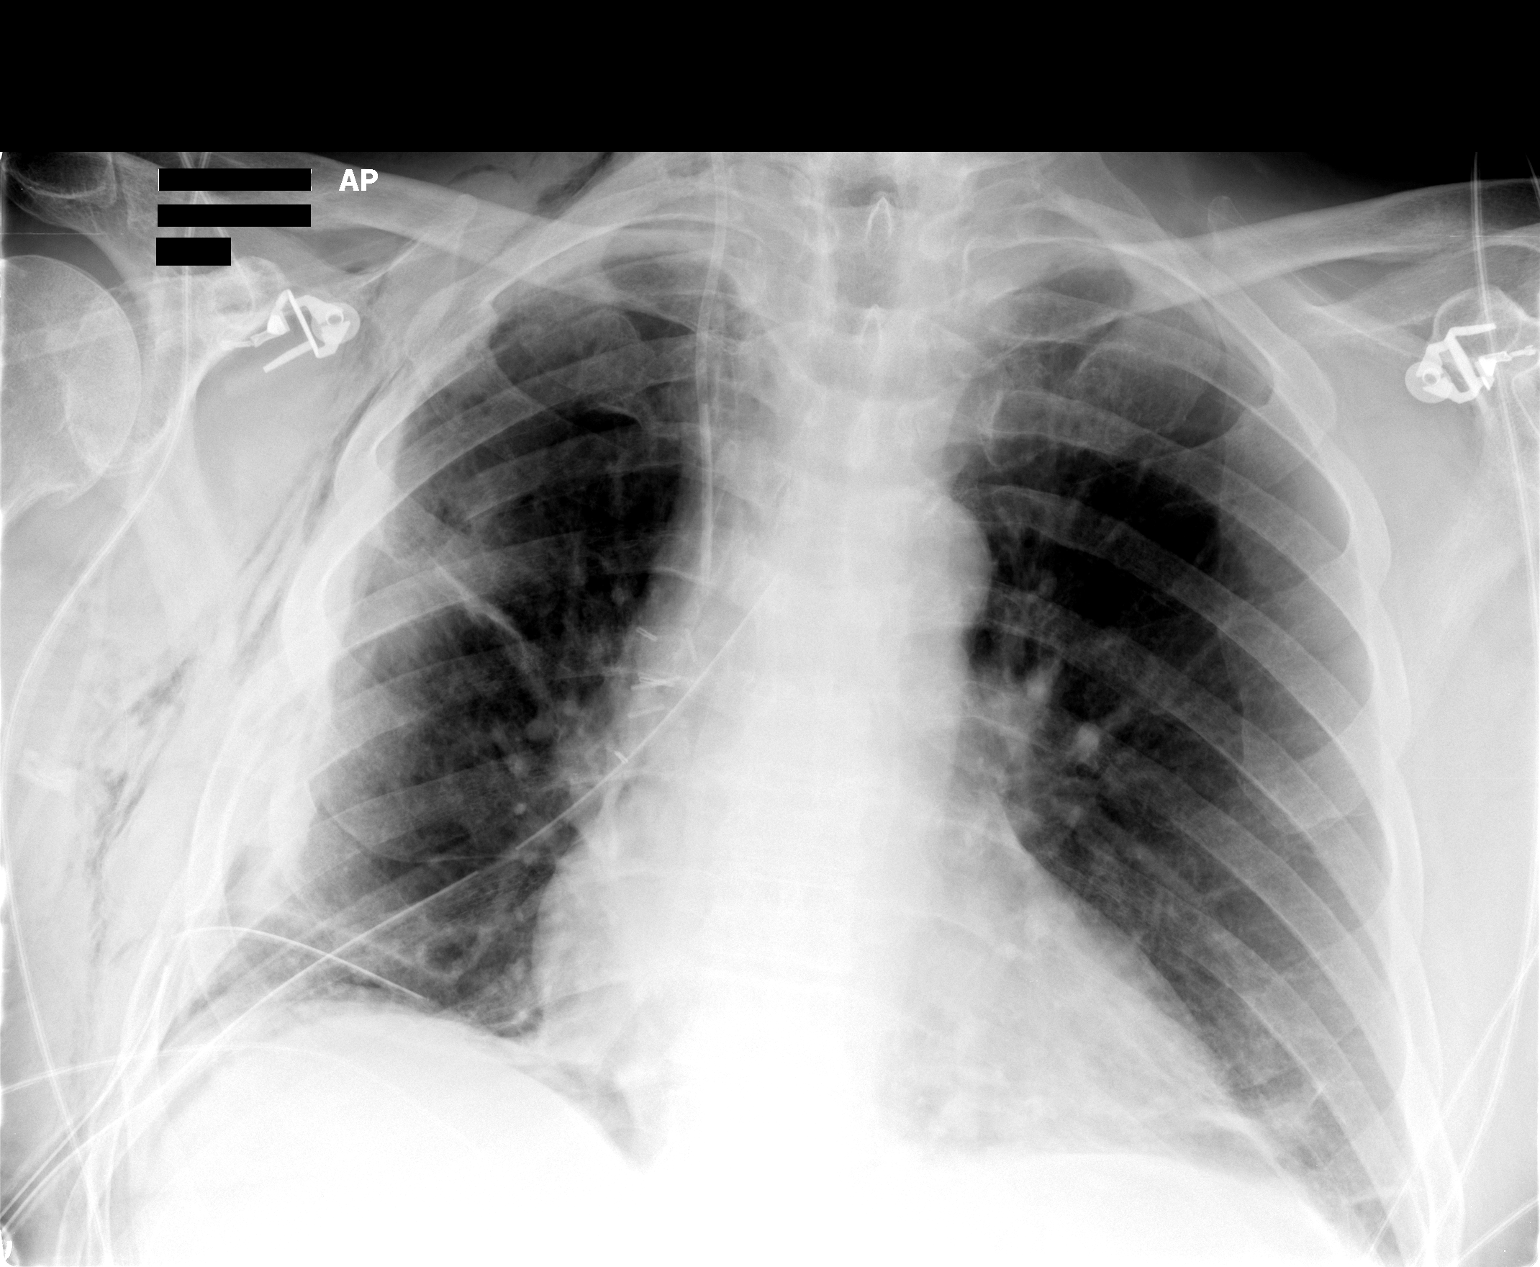

[1 of 1 positions shown; findings below may reference images not displayed]

FINDINGS: 6060 hours.  Central line and two right sided chest tubes
remain in place.  One of the chest tube's side port is external to
the pleural space.  No apical pneumothorax is demonstrated.  There
is a possible small basilar pneumothorax medially . There is
pleural thickening or loculated pleural fluid on the right and soft
tissue emphysema throughout the right chest wall.  Mild basilar
atelectasis appears stable.  The heart size and mediastinal
contours are unchanged.
IMPRESSION: Possible small medial basilar pneumothorax on the right.  Stable
basilar atelectasis.

## 2009-11-10 IMAGING — CR DG CHEST 1V PORT
1 series · 1 of 1 positions shown · non-contrast
Comparison: 02/26/2008

CLINICAL DATA: Right lower lobe mass.  Status post VATS.

PORTABLE CHEST - 1 VIEW

[view not recorded]
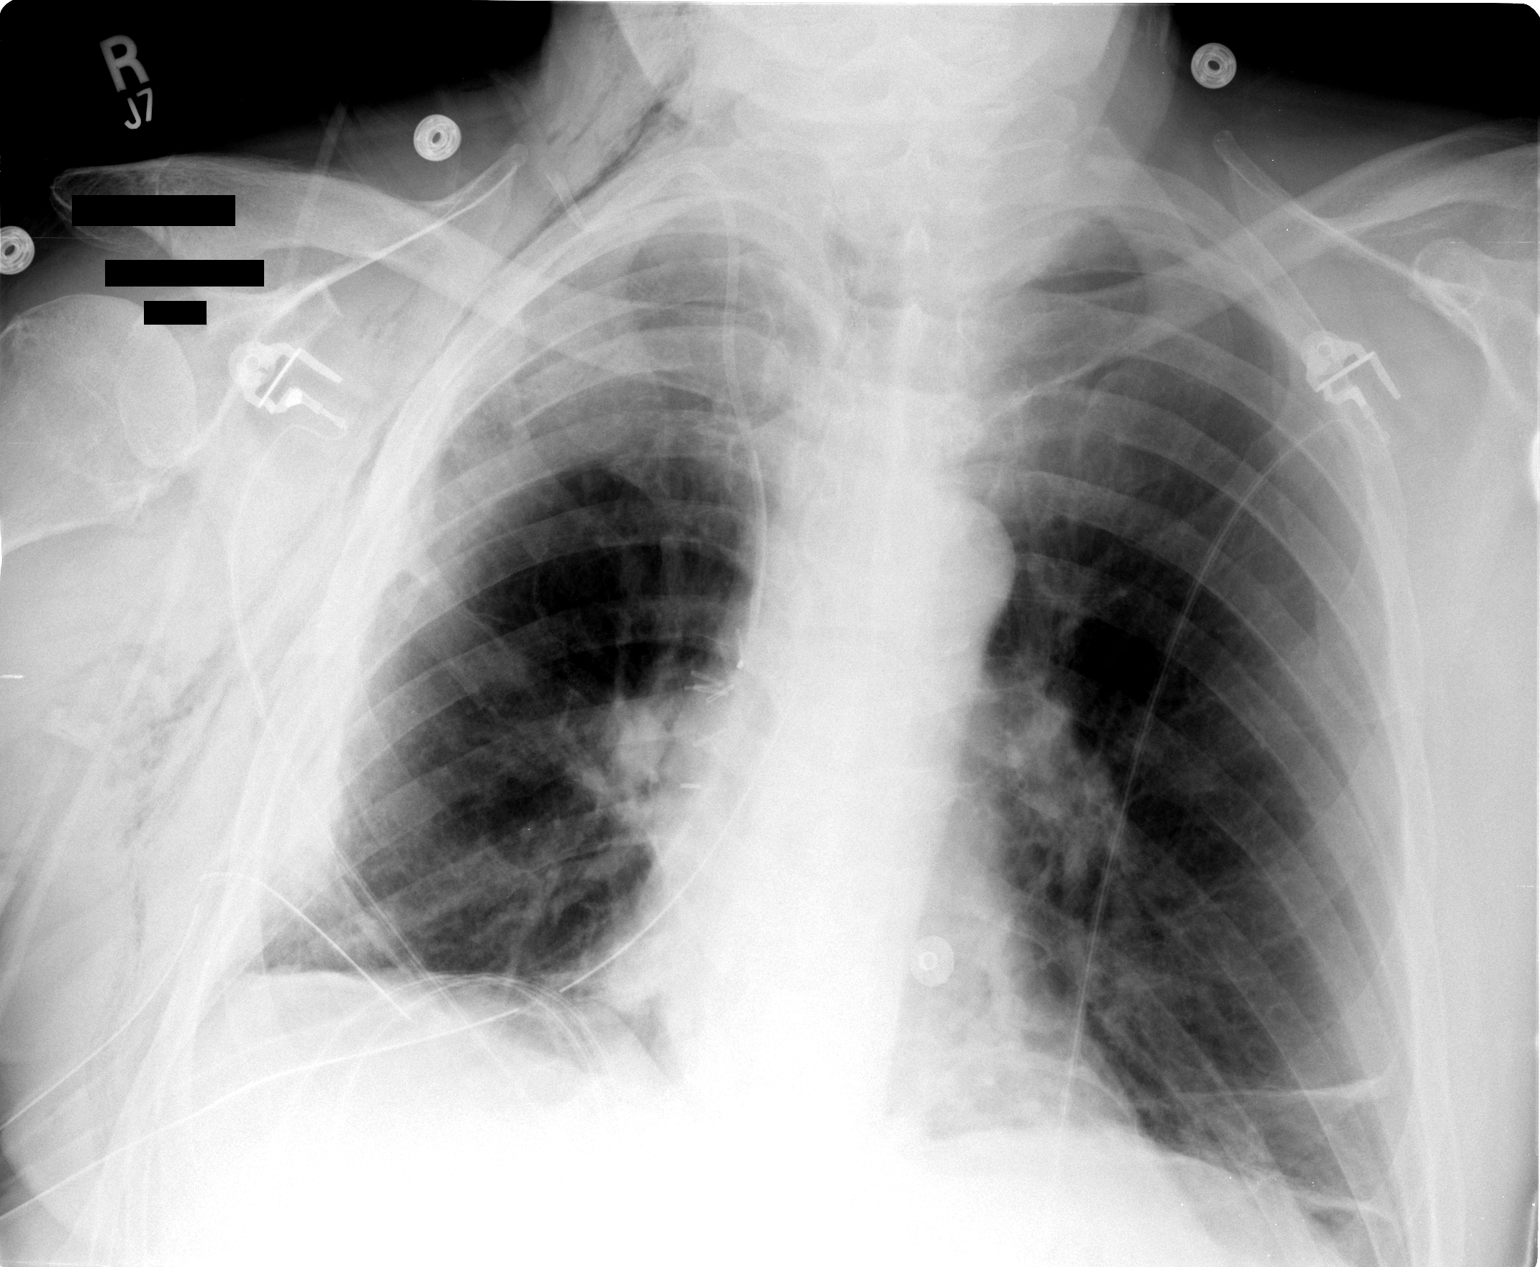

[1 of 1 positions shown; findings below may reference images not displayed]

FINDINGS: The right IJ line is stable position.  The patient is
slightly rotated on this film.  Extensive right-sided pleural
thickening and small effusion are redemonstrated.  Linear
atelectasis evident on the prior film is no longer present in the
right midlung.  Linear atelectasis at the left base remains.  To
right-sided chest tubes are in place.  Lucency at the right medial
costophrenic angle is redemonstrated.  However, this study is
stated the upper right to.  If there is air here, it would have to
be loculated. Subcutaneous air on the right to is diminished
compared to prior exam.
IMPRESSION: 1.  Two right-sided chest use in place without definite
pneumothorax.  Please see above comment.
2.  Residual pleural thickening represents scar or small amount of
fluid.
3.  Support apparatus stable

## 2009-11-11 IMAGING — CR DG CHEST 1V PORT
1 series · 1 of 1 positions shown · non-contrast
Comparison: 02/27/2008

CLINICAL DATA: Right lung mass

PORTABLE CHEST - 1 VIEW

[AP]
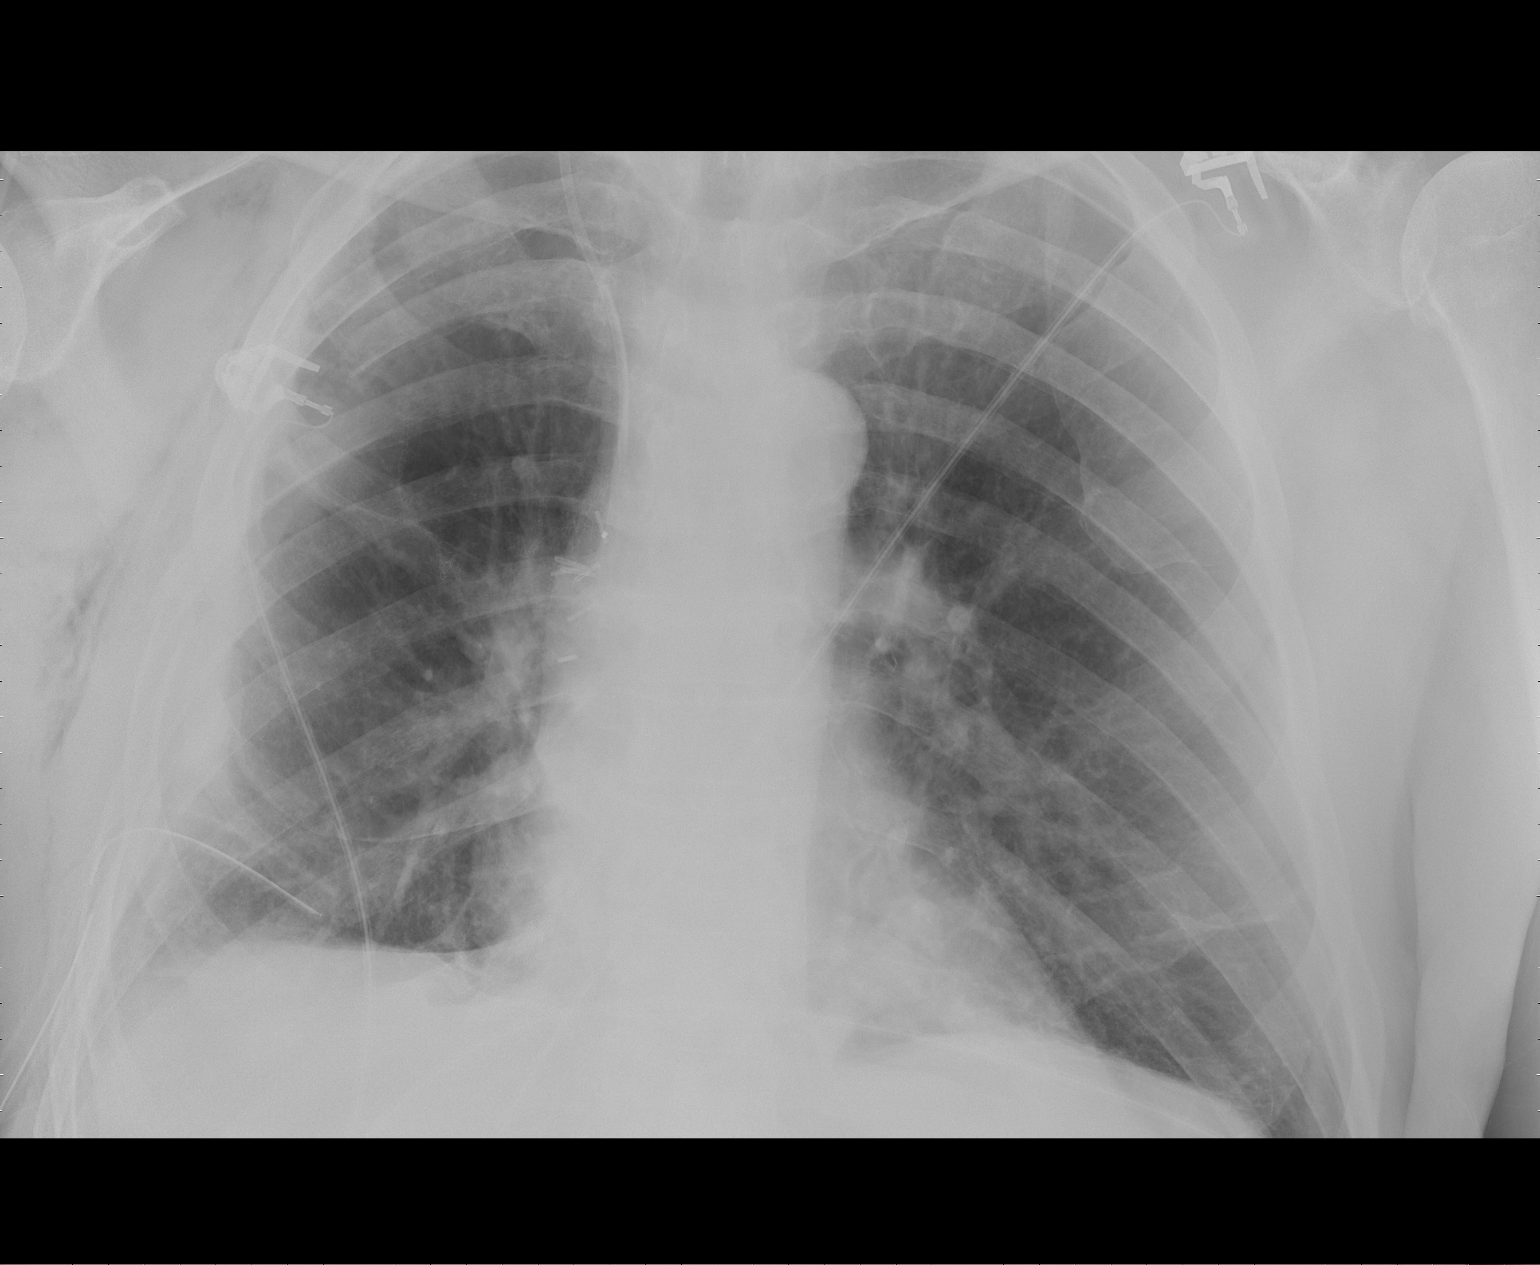

[1 of 1 positions shown; findings below may reference images not displayed]

FINDINGS: Interval removal of 1of2 right chest tubes.  The proximal
side hole of the residual chest tube appears external to the
thoracic cage.  No pneumothorax.  Vascular clips in the right
hilum.  Right IJ central line stable.  Patchy atelectasis or
infiltrate in both lung bases.  Heart size normal.  Lateral right
subcutaneous emphysema stable.  Changes of right thoracotomy.
IMPRESSION: 1.  Removal of 1 of 2 right chest tubes with no pneumothorax.

## 2010-03-24 ENCOUNTER — Ambulatory Visit: Payer: Self-pay | Admitting: Oncology

## 2010-03-28 ENCOUNTER — Ambulatory Visit (HOSPITAL_COMMUNITY): Admission: RE | Admit: 2010-03-28 | Discharge: 2010-03-28 | Payer: Self-pay | Admitting: Oncology

## 2010-03-28 LAB — COMPREHENSIVE METABOLIC PANEL
BUN: 7 mg/dL (ref 6–23)
CO2: 30 mEq/L (ref 19–32)
Calcium: 9.2 mg/dL (ref 8.4–10.5)
Chloride: 102 mEq/L (ref 96–112)
Creatinine, Ser: 1.03 mg/dL (ref 0.40–1.50)

## 2010-03-28 LAB — CBC WITH DIFFERENTIAL/PLATELET
Basophils Absolute: 0 10*3/uL (ref 0.0–0.1)
EOS%: 2.6 % (ref 0.0–7.0)
HCT: 45.6 % (ref 38.4–49.9)
HGB: 15.8 g/dL (ref 13.0–17.1)
MCH: 31.3 pg (ref 27.2–33.4)
MONO#: 0.7 10*3/uL (ref 0.1–0.9)
NEUT%: 56.5 % (ref 39.0–75.0)
Platelets: 207 10*3/uL (ref 140–400)
lymph#: 1.9 10*3/uL (ref 0.9–3.3)

## 2010-03-28 LAB — LACTATE DEHYDROGENASE: LDH: 145 U/L (ref 94–250)

## 2010-04-04 ENCOUNTER — Ambulatory Visit: Payer: Self-pay | Admitting: Thoracic Surgery

## 2010-09-22 ENCOUNTER — Ambulatory Visit: Payer: Self-pay | Admitting: Oncology

## 2010-09-26 ENCOUNTER — Ambulatory Visit (HOSPITAL_COMMUNITY)
Admission: RE | Admit: 2010-09-26 | Discharge: 2010-09-26 | Payer: Self-pay | Source: Home / Self Care | Attending: Oncology | Admitting: Oncology

## 2010-09-26 LAB — CMP (CANCER CENTER ONLY)
ALT(SGPT): 28 U/L (ref 10–47)
AST: 24 U/L (ref 11–38)
Albumin: 3.1 g/dL — ABNORMAL LOW (ref 3.3–5.5)
Alkaline Phosphatase: 69 U/L (ref 26–84)
BUN, Bld: 11 mg/dL (ref 7–22)
CO2: 31 mEq/L (ref 18–33)
Calcium: 9.2 mg/dL (ref 8.0–10.3)
Chloride: 100 mEq/L (ref 98–108)
Creat: 0.9 mg/dl (ref 0.6–1.2)
Glucose, Bld: 104 mg/dL (ref 73–118)
Potassium: 4.5 mEq/L (ref 3.3–4.7)
Sodium: 144 mEq/L (ref 128–145)
Total Bilirubin: 0.8 mg/dl (ref 0.20–1.60)
Total Protein: 6.8 g/dL (ref 6.4–8.1)

## 2010-09-26 LAB — CBC WITH DIFFERENTIAL/PLATELET
BASO%: 0.3 % (ref 0.0–2.0)
Basophils Absolute: 0 10*3/uL (ref 0.0–0.1)
EOS%: 1.5 % (ref 0.0–7.0)
Eosinophils Absolute: 0.1 10*3/uL (ref 0.0–0.5)
HCT: 45.9 % (ref 38.4–49.9)
HGB: 15.8 g/dL (ref 13.0–17.1)
LYMPH%: 17.2 % (ref 14.0–49.0)
MCH: 31.2 pg (ref 27.2–33.4)
MCHC: 34.3 g/dL (ref 32.0–36.0)
MCV: 91 fL (ref 79.3–98.0)
MONO#: 0.7 10*3/uL (ref 0.1–0.9)
MONO%: 7.2 % (ref 0.0–14.0)
NEUT#: 7.5 10*3/uL — ABNORMAL HIGH (ref 1.5–6.5)
NEUT%: 73.8 % (ref 39.0–75.0)
Platelets: 225 10*3/uL (ref 140–400)
RBC: 5.05 10*6/uL (ref 4.20–5.82)
RDW: 13.4 % (ref 11.0–14.6)
WBC: 10.1 10*3/uL (ref 4.0–10.3)
lymph#: 1.7 10*3/uL (ref 0.9–3.3)

## 2010-09-26 LAB — LACTATE DEHYDROGENASE: LDH: 142 U/L (ref 94–250)

## 2010-10-03 ENCOUNTER — Ambulatory Visit
Admission: RE | Admit: 2010-10-03 | Discharge: 2010-10-03 | Payer: Self-pay | Source: Home / Self Care | Attending: Thoracic Surgery | Admitting: Thoracic Surgery

## 2010-10-07 ENCOUNTER — Other Ambulatory Visit: Payer: Self-pay | Admitting: Oncology

## 2010-10-07 DIAGNOSIS — Z85118 Personal history of other malignant neoplasm of bronchus and lung: Secondary | ICD-10-CM

## 2010-10-08 ENCOUNTER — Encounter: Payer: Self-pay | Admitting: Thoracic Surgery

## 2011-01-30 NOTE — Assessment & Plan Note (Signed)
OFFICE VISIT   HERSHEY, KNAUER  DOB:  1928-11-01                                        March 25, 2008  CHART #:  [id number]   He came in today and he is breathing better.  His chest x-ray is stable.  They reduced his dose of vancomycin and I will get another laboratory on  Monday.  He still has 2 to 3+ edema of his feet, but I think this has  slightly improved.  I have increased his Lasix to 80 daily and I will  see him back next Tuesday.  His blood pressure was 109/62, pulse 79,  respirations 20, and sats were 95%.   Ines Bloomer, M.D.  Electronically Signed   DPB/MEDQ  D:  03/25/2008  T:  03/26/2008  Job:  [id number]

## 2011-01-30 NOTE — Assessment & Plan Note (Signed)
OFFICE VISIT   LLEWELLYN, CHOPLIN  DOB:  July 13, 1929                                        March 30, 2008  CHART #:  [id number]   Mr. Janee Morn returned today.  His chest x-ray is stable.  He is getting  his last dose of vancomycin tomorrow.  His laboratory was still pending.  His blood pressure is 121/72, pulse 76, respirations 18, sats were 95%.  Lungs were clear to auscultation and percussion.  He had a marked  decrease in his edema, on 80 of Lasix daily.  We will plan to check his  laboratory and continue on Lasix and add potassium.  He also complains  some back pain so I gave him prescription for Lortab 7.5, 500 APAP #40.   Ines Bloomer, M.D.  Electronically Signed   DPB/MEDQ  D:  03/30/2008  T:  03/31/2008  Job:  [id number]

## 2011-01-30 NOTE — Discharge Summary (Signed)
NAME:  Jose Gordon, Jose Gordon NO.:  [id number]   MEDICAL RECORD NO.:  [id number]          PATIENT TYPE:  INP   LOCATION:  3311                         FACILITY:  [facility]   PHYSICIAN:  Ines Bloomer, M.D. DATE OF BIRTH:  05/14/1929   DATE OF ADMISSION:  03/07/2008  DATE OF DISCHARGE:  09/24/2007                               DISCHARGE SUMMARY   ADMITTING DIAGNOSES:  1. Status post redo right thoracotomy, wedge resection of right lower      lobe and lymph node dissection by Dr. 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  2. Loculated right pleural effusion with empyema.  3. Hyponatremia.  4. History of chronic obstructive pulmonary disease.  5. History of hypertension.  6. Contusion likely secondary to hyponatremia.   DISCHARGE DIAGNOSES:  1. Status post redo right thoracotomy, wedge resection of right lower      lobe and lymph node dissection by Dr. 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  2. Loculated right pleural effusion with empyema.  3. Hyponatremia.  4. History of chronic obstructive pulmonary disease.  5. History of hypertension.  6. Contusion likely secondary to hyponatremia.  7. Atrial fibrillation with conversion to normal sinus rhythm.   PROCEDURES:  Insertion of right IJ catheter on the right chest tube by  Dr. Edwyna Shell on March 08, 2008.  Right chest tube placed by interventional  radiology on March 13, 2008.   HOSPITAL COURSE STAY:  This is a 77 year old male known to Dr. 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  He was recently discharged from the hospital and he began  experiencing increasing weakness and a decreased appetite.  Apparently,  also developed a rash that may or may not have been related to the  metoprolol and/or Ultram.   A 48 hours prior to his admission, he  developed increasing shortness of breath, confusion, and some cough.  He  was found to have an elevated white count of 24,200.  He was  hyponatremic his sodium was 122.  Chest x-ray done showed persistent  small apical pneumothorax significant increase in the right pleural  effusions with associated atelectasis.  CT of the chest was also done  which showed no evidence of acute pulmonary embolus however, a large  right hydropneumothorax was seen.  The patient was admitted for further  evaluation and treatment.  He was empirically started on vancomycin and  Zosyn.  A critical care medicine consult was obtained.  He continued to  following him throughout his hospital course stay.  Dr. Cyndie Chime also  saw the patient as the patient was well known to him.  The patient went  to atrial fibrillation on March 08, 2008.  He was originally given  Cardizem.  He was then given an amiodarone drip and Cardizem was  stopped.  He was then given amiodarone  p.o. and amiodarone drip was  stopped.  He converted to normal sinus rhythm.  As previously stated,  the patient had insertion of a right chest tube by Dr. Edwyna Shell on March 08, 2008.  Pleural fluid was consistent with empyema.  Cultures were  positive MRSA.  The Zosyn was stopped.  He was continue on vancomycin.  His white blood cell count continued to decrease.  His hyponatremia  improved as well.  The patient chest tube was taken off suction.  However, on March 12, 2008, the right chest tube was accidentally pulled  out by the patient.  An another right chest tube was placed by  interventional radiologist on March 13, 2008 approximately 100 mL was  extubated fluid was aspirated.  Gram smear showed no organisms seen up  on the white blood cells was present and culture showed few methicillin-  resistant staph aureus.  CT drainage continued to decrease.  The patient  was switched to [employer] 500 on March 16, 2008.   It should also noted  that the patient had a previous PICC line placed administration of  vancomycin.  A chest x-ray will be obtained in the morning and provided  the patient stays afebrile and hemodynamically stable, he will be  discharged on March 17, 2008.   LABORATORY DATA:  Latest laboratory studies show BMET done today,  potassium 3.4, sodium 134, BUN and creatinine 2 and 0.82 respectively.  CBC done, [place] down to 10400, H&H 11.2 and 32.2 respectively,  platelet count of 321,000.   IMAGING:  Chest x-ray done today showed postoperative changes with the  left lungs essentially clear.  No new significant abnormalities or  pneumothorax seen.   DISCHARGE INSTRUCTIONS:  Include the following, the patient may shower.  He is to use soap and water and let his wounds open to the air.  He is  to continue use his incentive spirometer daily.  He is to walk daily and  increase his friction duration as tolerated.  He is not to lift or drive  for 3-4 weeks.  His followup appointments include Dr. Edwyna Shell in 1 week  he needs to call the office for an appointment time.  He also needs to  call Dr. Patsy Lager office to make a followup appointment as well.   DISCHARGE MEDICATIONS:  1. Lopressor 12.5 mg p.o. 2 times daily.  2. Spiriva inhaler 1 puff daily.  3. Albuterol inhaler 2 puffs daily.  4. Alprazolam 0.5 mg p.o. bedtime p.r.n.  5. Multivitamin p.o. daily.  6. Calcium 600 plus vitamin D 2 pills p.o. daily.  7. Glucosamine and chondroitin 2 tablets p.o. daily.  8. Omeprazole 20 mg p.o. daily.  9. EC ASA 81 mg p.o. daily.  10.Amiodarone 200 mg p.o. daily.  11.Hydroxyzine 25 mg p.o. twice daily p.r.n. rash per the patient's      request.  No pain script requested.      Doree Fudge, [place]      Ines Bloomer, M.D.  Electronically Signed    DZ/MEDQ  D:  03/16/2008  T:  03/17/2008  Job:  [id number]   cc:   Genene Churn. Cyndie Chime, M.D.

## 2011-01-30 NOTE — Assessment & Plan Note (Signed)
OFFICE VISIT   Jose Gordon, Jose Gordon  DOB:  November 03, 1928                                        October 03, 2010  CHART #:  [id number]   His blood pressure was 178/90, pulse 80, respirations 18, sats were 97%.  He is on some prednisone for upper respiratory infection.  His CT scan  showed no evidence of recurrence of his cancer.  He has another one  scheduled on April 02, 2010, and I will see him back again in early  August for follow up on this.  Overall, though everything appears to be  stable, I will probably release him after the next CT scan.   Ines Bloomer, M.D.  Electronically Signed   DPB/MEDQ  D:  10/03/2010  T:  10/04/2010  Job:  [id number]

## 2011-01-30 NOTE — Assessment & Plan Note (Signed)
OFFICE VISIT   Jose Gordon, Jose Gordon  DOB:  18-Oct-1928                                        July 28, 2008  CHART #:  [id number]   I saw the patient today.  His chest x-ray is stable.  No evidence of  recurrence of any of his pneumothorax or effusion.  His only complaint  is that he is losing his hair.  His blood pressure is 149/79, pulse 61,  respirations 18, and sats are 98%.  I will plan to see him back again in  late January after he has his CT scan.   Ines Bloomer, M.D.  Electronically Signed   DPB/MEDQ  D:  07/28/2008  T:  07/28/2008  Job:  [id number]   cc:   Gaspar Garbe, M.D.  Genene Churn. Cyndie Chime, M.D.

## 2011-01-30 NOTE — Op Note (Signed)
NAME:  Jose Gordon, Jose Gordon NO.:  [id number]   MEDICAL RECORD NO.:  [id number]          PATIENT TYPE:  INP   LOCATION:  2308                         FACILITY:  [facility]   PHYSICIAN:  Ines Bloomer, M.D. DATE OF BIRTH:  February 17, 1929   DATE OF PROCEDURE:  DATE OF DISCHARGE:                               OPERATIVE REPORT   PREOPERATIVE DIAGNOSIS:  Right chest empyema.   POSTOPERATIVE DIAGNOSIS:  Right chest empyema.   OPERATION PERFORMED:  Insertion of right IJ catheter and right chest  tube.   For 3 mg of Versed, the anterior right chest was prepped and draped in  the usual sterile manner.  An area was infiltrated with 1% Xylocaine,  infraclavicular area in the left, right subclavian puncture was  performed, and a chest guidewire threaded to the right atrium.  The  needle was removed.  A stab wound was made around the needle and needle  was passed a dilator and then a 20-cm three ports CVP.  The dilator was  removed, mainly the guidewire was removed and the catheter was sutured  in place with 2-0 silk and flushed with saline.  The patient was then  turned to the left lateral thoracotomy position and the previous chest  tube sutures were removed where he had had a previous thoracotomy.  It  was suspected that he had a postop empyema.  The posterior chest tube  was infiltrated with 1% Xylocaine and then dissection was carried down  through the subcutaneous tissue to the chest and then the chest was  entered with a clamp and then a #32 chest tube was inserted.  This was  sutured in place with 0 silk and dressing was applied.  The patient  drained 1000 mL of questionably purulent fluid.  It was connected to 40  cm suctioned.  The patient tolerated the procedure well.  Chest x-ray  was ordered.      Ines Bloomer, M.D.  Electronically Signed     DPB/MEDQ  D:  03/08/2008  T:  03/09/2008  Job:  [id number]

## 2011-01-30 NOTE — Discharge Summary (Signed)
NAME:  Jose Gordon, Jose Gordon NO.:  [id number]   MEDICAL RECORD NO.:  [id number]          PATIENT TYPE:  INP   LOCATION:  3302                         FACILITY:  [facility]   PHYSICIAN:  Ines Bloomer, M.D. DATE OF BIRTH:  August 14, 1929   DATE OF ADMISSION:  02/24/2008  DATE OF DISCHARGE:                               DISCHARGE SUMMARY   PRIMARY ADMITTING DIAGNOSIS:  Right lower lobe lung lesion.   ADDITIONAL/DISCHARGE DIAGNOSES:  1. Right lower lobe squamous cell carcinoma.  2. History of squamous cell carcinoma of the right lung status post      previous right upper lobectomy.  3. Hypertension.  4. Chronic obstructive pulmonary disease.  5. Prior history of tobacco abuse.   PROCEDURES PERFORMED:  Redo right thoracotomy, right lower lobe wedge  resection, and lymph node dissection.   HISTORY:  The patient is a 77 year old male who is status post a  previous right upper lobectomy for squamous cell carcinoma stage IB in  January 2005.  He has been followed by Dr. Cyndie Chime since that time  and recently was found to have a new right lower lobe lesion.  PET scan  confirmed increased uptake in the area of this lesion.  He was  subsequently referred to Dr. Edwyna Shell for evaluation for consideration of  resection.  Dr. Edwyna Shell reviewed his films and agreed that his best  course of action would be to proceed with a right lower lobe wedge  resection at this time.  He explained the risks, benefits, and  alternatives of the procedure to the patient, and he agreed to proceed.   HOSPITAL COURSE:  [name] was admitted to [facility] on  February 24, 2008.  He underwent a redo right thoracotomy with right lower  lobe wedge resection and lymph node dissection.  He tolerated the  procedure well and was transferred to the SICU in stable condition  postoperatively.  His postoperative course has been uneventful.  He did  have an air leak initially in his chest tube, but this has  gradually  resolved.  He underwent removal of his anterior chest tube on postop day  #3.  A followup chest x-rays remained stable.  He will most likely have  the remaining chest tube removed within the next 24 hours provided he  continues to have no evidence of air leak.  He has otherwise done well.  He is ambulating in the hallway and in his room without difficulty.  He  is tolerating a regular diet and is having normal bowel and bladder  function.  He is afebrile, and his vital signs have been stable.  His  labs on postop day #3 show hemoglobin 13.1, hematocrit 37.3, white count  7.3, platelets 175, sodium 133, potassium 3.9, BUN 4, creatinine 0.61.  It is anticipated that if he remains stable and no other acute changes  occur, he will hopefully be ready for discharge home within the next 48  to 72 hours pending chest tube removal and followup chest x-ray.  Final  pathology was positive for squamous cell carcinoma, moderately  differentiated and all nodes were  negative.   Discharge medications are as follows:  1. Ultram 50 to 100 mg q.4 h. p.r.n. for pain.  2. Multivitamin daily.  3. Calcium 600 plus D 2 tablets daily.  4. Glucosamine and chondroitin 2 tablets daily.  5. Omeprazole 20 mg daily.  6. Doxazosin 8 mg 1/2 tablet q.h.s.  7. Aspirin 81 mg daily.  8. Alprazolam 0.5 mg 2 p.o. q.h.s.  9. Albuterol inhaler 2 puffs daily.  10.Spiriva inhaler 1 puff daily.  11.Fentanyl 25 mcg patch q.72 h.   DISCHARGE INSTRUCTIONS:  He is asked to refrain from driving, heavy  lifting, or strenuous activity.  He may continue ambulating daily and  using his incentive spirometer.  He may shower daily and clean his  incisions with soap and water.  He will continue the same preoperative  diet.   DISCHARGE FOLLOWUP:  He will see Dr. Edwyna Shell back in the office in 1 week  with a chest x-ray.  He will follow up as directed with Dr. Cyndie Chime.  In the interim if he experiences any problems or has  questions, he is  asked to contact our office.      Coral Ceo, P.A.      Ines Bloomer, M.D.  Electronically Signed    GC/MEDQ  D:  02/27/2008  T:  02/28/2008  Job:  [id number]   cc:   Genene Churn. Cyndie Chime, M.D.

## 2011-01-30 NOTE — Assessment & Plan Note (Signed)
OFFICE VISIT   Jose Gordon, Jose Gordon  DOB:  1928-09-24                                        April 06, 2009  CHART #:  [id number]   His CT scan today showed no evidence of recurrence of his cancer.  His  blood pressure was 136/74, pulse 76, respirations 20, and saturations  were 97%.  Lungs were clear to auscultation and percussion.  I will see  him back again in 6 months with another CT scan after he sees Dr.  Arbutus Ped.   Ines Bloomer, M.D.  Electronically Signed   DPB/MEDQ  D:  04/06/2009  T:  04/07/2009  Job:  [id number]

## 2011-01-30 NOTE — Letter (Signed)
April 04, 2010   Genene Churn. Cyndie Chime, MD  [address] [facility]  [place], [place] [address]   Re:  DILYN, SMILES                  DOB:  Dec 12, 1928   Dear Fayrene Fearing,   I saw the patient back today.  He is now 2 years since his last surgery  with no evidence of recurrence of cancer.  He is doing well overall from  my standpoint, although he still is very short of breath.  His blood  pressure was 147/82, pulse 67, respirations 18, and sats were 96%.  I  plan to see him back again in January with his next CT scan.   Ines Bloomer, M.D.  Electronically Signed   DPB/MEDQ  D:  04/04/2010  T:  04/05/2010  Job:  [id number]

## 2011-01-30 NOTE — Assessment & Plan Note (Signed)
OFFICE VISIT   YORDIN, RHODA  DOB:  1929-05-26                                        April 13, 2008  CHART #:  [id number]   The patient came today and there is no evidence of recurrence of his  empyema.  His white count was 9200, hematocrit 35, and his potassium was  4.6 with a BUN of 14.  His lungs were clear to auscultation and  percussion.  His incision is well healed.  I told him that he needs to  stop his potassium, his Lasix, and his amiodarone.  I gave him  prescription for Toprol-XL 25 mg to take one a day.  We will plan to see  him back in 6 weeks' with another chest x-ray.   Ines Bloomer, M.D.  Electronically Signed   DPB/MEDQ  D:  04/13/2008  T:  04/13/2008  Job:  [id number]

## 2011-01-30 NOTE — Assessment & Plan Note (Signed)
OFFICE VISIT   BOND, GRIESHOP  DOB:  10-30-1928                                        May 25, 2008  CHART #:  [id number]   HISTORY OF PRESENT ILLNESS:  The patient is status post right chest tube  insertion by Dr. Edwyna Shell in March 08, 2008, for drainage of an empyema  MRSA positive.  Prior to this, he was status post redo right  thoracotomy, wedge resection of right lower lobe and lymph node  dissection on February 24, 2008, for non-small cell squamous cell cancer of  the right lower lobe.  The patient presents today for followup visit.  The patient feels that he is improving slowly.  He still complains of  shortness of breath increased with exertion.  He has been started on  albuterol inhaler and Spiriva, which she is continuing and feels it does  help.  He does complain of not being able to sleep at night and still  weak but is ambulating as much as possible.   Respiratory is diminished on the right.  Cardiac, regular rate and  rhythm.  Incisions are all healed well.   STUDIES:  The patient had a PA and lateral chest x-ray done today  May 25, 2008.  This shows no change in the segmental resection  posterior lateral right fifth rib with no persistent hydropneumothorax  identified.  There is a slight residual postoperative pleural  parenchymal changes in mid and lower right thorax.  No active disease  noted.   IMPRESSION AND PLAN:  The patient is progressing well.  He was seen and  evaluated by Dr. Edwyna Shell.  Dr. Edwyna Shell evaluated the patient's chest x-  ray.  The patient is encouraged to continue ambulating as tolerated and  working on his breathing exercises.  We will plan to see the patient  back in November following a CT scan of his chest done in October.  The  patient is told if he has any surgical questions, he is to contact us  during the interim.   Ines Bloomer, M.D.  Electronically Signed   KMD/MEDQ  D:  05/25/2008  T:   05/26/2008  Job:  [id number]

## 2011-01-30 NOTE — Op Note (Signed)
NAME:  Jose Gordon, Jose Gordon NO.:  [id number]   MEDICAL RECORD NO.:  [id number]          PATIENT TYPE:  INP   LOCATION:  2314                         FACILITY:  [facility]   PHYSICIAN:  Ines Bloomer, M.D. DATE OF BIRTH:  02-07-1929   DATE OF PROCEDURE:  02/24/2008  DATE OF DISCHARGE:                               OPERATIVE REPORT   PREOPERATIVE DIAGNOSIS:  Right lower lobe mass.   POSTOPERATIVE DIAGNOSIS:  Non-small cell lung cancer, squamous cell  cancer, right lower lobe.   OPERATION PERFORMED:  Redo right thoracotomy, wedge resection of right  lower lobe, and node dissection.   SURGEON:  Ines Bloomer, MD   FIRST ASSISTANT:  Coral Ceo, PA-C   ANESTHESIA:  General anesthesia.   PROCEDURE IN DETAIL:  After percutaneous insertion of all monitoring  lines, the patient underwent general anesthesia.  He was turned to the  right lateral thoracotomy position.  This patient had a previous right  upper lobectomy for cancer and was found to have a second lesion in the  medial basilar segment of the right lower lobe.  He was brought to the  operating room for a redo thoracotomy.  A dual-lumen tube was inserted.  The right lung was deflated.  Previous incision was opened and  dissection was carried down through the subcutaneous tissue down to the  latissimus which was divided and the serratus anterior was partially  divided and the area of the previous surgery was entered.  We decided to  take the sixth rib and this was dissected out with electrocautery as  well as the Alexander retractor until we moved the ridge back through  the angle with the Bethune rib shear.  We then dissected anteriorly to  the anterior axillary line and resected the rib again with the rib  shear.  This allowed [place] to enter the chest and we took down the right  middle lobe and right lower lobe, all adhesions with electrocautery.  The right lower lobe was stuck to the diaphragm.  We took that  down with  electrocautery as well as sharp dissection with scissors.  Finally, the  lesion was palpated in the medial basilar segment of the right lower  lobe and coming across that with a auto suture stapler it was resected.  We could not resect any more because it was right up next to the  inferior pulmonary vein and he did not tolerate lobectomy or subsequent  completion pneumonectomy.  Then we dissected out 9R and several 7 nodes.  We put 2 chest tubes through separate stab wounds, a right angle  posteriorly and straight anteriorly.  There were several tears in the  lung from taking down the adhesions and these were oversewn with 3-0  Vicryl.  We applied FloSeal to the staple line.  The lesion was a non-  small cell lung cancer, squamous cell cancer, and the margins were  negative, although closed.  Chest was closed with 6 pericostals, #1  Vicryl in the muscle layer, 2-0 Vicryl in the subcutaneous tissue, and  Dermabond for the skin.  The patient was returned to  the recovery room  in stable condition.     Ines Bloomer, M.D.  Electronically Signed    DPB/MEDQ  D:  02/24/2008  T:  02/25/2008  Job:  [id number]

## 2011-01-30 NOTE — Discharge Summary (Signed)
NAME:  Jose Gordon, Jose Gordon NO.:  [id number]   MEDICAL RECORD NO.:  [id number]          PATIENT TYPE:  INP   LOCATION:  3311                         FACILITY:  [facility]   PHYSICIAN:  Doree Fudge, PA DATE OF BIRTH:  [birth year]   DATE OF ADMISSION:  03/07/2008  DATE OF DISCHARGE:                               DISCHARGE SUMMARY   ADDENDUM   DISCHARGE MEDICATIONS:  Vancomycin 1250 mg IV q.12 h. (10:00 a.m. and  10:00 p.m.) starting on March 17, 2008, home health nurse will be  arranged.  We will check a vancomycin trial on March 22, 2008, 30 minutes  before his 10:00 a.m. dose; and per Dr. Edwyna Shell, vancomycin is to be  administered for 3 more weeks.      Doree Fudge, PA     DZ/MEDQ  D:  03/16/2008  T:  03/17/2008  Job:  [id number]

## 2011-01-30 NOTE — Assessment & Plan Note (Signed)
OFFICE VISIT   Jose Gordon, Jose Gordon  DOB:  1929-08-02                                        March 23, 2008  CHART #:  [id number]   The patient came for followup today.  His blood pressure is 124/65,  pulse 70, respirations 20, and sats were 92%.  His chest x-ray shows  decrease in the right pleural fluid.  The left lung is clear, and he  looks better than he did before.  I relayed to him my sympathies.  We  got his lab back; his white count was normal, and his creatinine was  normal.  His protein was 6.0 and albumin of 2.6.  On examination though  he had 3+ edema in his feet, depended edema, as well as 1 to 2+ edema of  his lower forearms, and he has been ambulating but appears very weak.  I  encouraged him to increase his appetite, put him on Lasix 40 mg a day,  and we will see him back again. I went ahead and removed his chest tube.  Plan to see him back again in 2 days with chest x-ray.  Instructed the  family to call if he had any deterioration.   Ines Bloomer, M.D.  Electronically Signed   DPB/MEDQ  D:  03/23/2008  T:  03/24/2008  Job:  [id number]

## 2011-01-30 NOTE — Letter (Signed)
October 06, 2008   Genene Churn. Cyndie Chime, MD  [address] [facility]  [place]  [place] [address]   Re:  Jose Gordon, KILLE                  DOB:  [birth year]   Dear Fayrene Fearing,   I saw the patient in the office today.  We stopped his Lopressor.  His  blood pressure is 148/80, pulse 65, respirations 18, and sats were 98%.  Lungs were clear to auscultation and percussion.  He says he has gained  20 pounds.  His CT scan showed no evidence of recurrence.  Overall, he  is doing well except some increasing shortness of breath.  I told him to  see Dr. Wylene Simmer for this.  I will see him back again in 4 months with a  chest x-ray.   Ines Bloomer, M.D.  Electronically Signed   DPB/MEDQ  D:  10/06/2008  T:  10/07/2008  Job:  [id number]

## 2011-01-30 NOTE — H&P (Signed)
NAME:  Jose Gordon, Jose Gordon NO.:  [id number]   MEDICAL RECORD NO.:  [id number]          PATIENT TYPE:  INP   LOCATION:  NA                           FACILITY:  [facility]   PHYSICIAN:  Ines Bloomer, M.D. DATE OF BIRTH:  1928/09/29   DATE OF ADMISSION:  DATE OF DISCHARGE:                              HISTORY & PHYSICAL   CHIEF COMPLAINT:  Lung mass.   HISTORY OF PRESENT ILLNESS:  This 77 year old patient had underwent a  right upper lobectomy and mediastinoscopy for a right upper lobe lesion.  This was done in January 2005.  He has a squamous cell carcinoma,  however, there was a stage IB.  He has been followed by Dr. Cyndie Chime  since then, and he was found to have a new right lower lobe lesion that  was positive on PET.  His pulmonary function tests showed an FVC of 2.97  with an FEV1 of 2.02.  The mediastinal nodes, however, were slightly  enlarged, but were negative on PET.  He had had a previous  mediastinoscopy.  He had no hemoptysis, fever, chills, or excessive  sputum.   He has no allergies.   MEDICATIONS:  1. Chlorthalidone 25 mg a day.  2. Omeprazole 20 mg a day.  3. Doxazosin 4 mg a day.  4. Naprosyn 500 mg a day.  5. Alprazolam.  6. Spiriva.  7. Fentanyl patch 25 mcg daily.   He has diabetes type 2, hypertension, and chronic obstructive pulmonary  disease.   FAMILY HISTORY:  Noncontributory.   He is married and retired.  Does not drink alcohol.  Does not smoke any  more, quit smoking in 2004.  Does not drink alcohol on a regular basis.   REVIEW OF SYSTEMS:  He is 229 pounds, he is 6 feet 4 inches, and he has  had some recent weight loss.  CARDIAC:  No angina or atrial  fibrillation.  PULMONARY:  No hemoptysis, fever, or chills.  GI:  Some  GERD.  GU:  No dysuria or frequent urination.  VASCULAR:  No  claudication, DVT, or TIAs.  NEUROLOGICAL:  No dizziness, headaches,  blackouts, or seizures.  MUSCULOSKELETAL:  He has severe arthritis.  PSYCHIATRIC:  No treatment for depression or nervousness.  EYES/ENT:  No  changes in eyesight or hearing.  HEMATOLOGICAL:  No problems with anemia  or bleeding problems.   PHYSICAL EXAMINATION:  His blood pressure is 130/75, pulse 60,  respirations 18, and sats were 95%.  HEAD:  Atraumatic.  EYES:  Pupils equal, reactive to light and accommodation.  Extraocular  movements are normal.  EARS:  Tympanic membranes intact.  NOSE:  There is no septal deviation.  Throat is without lesions.  NECK:  Supple without thyromegaly.  There is no supraclavicular or  axillary adenopathy.  HEART:  Regular sinus rhythm.  No murmurs.  ABDOMEN:  Soft.  There is no hepatosplenomegaly.  Pulses are 2+.  There  is no clubbing or edema.  NEUROLOGICAL:  He is oriented x3.  Sensory and motor intact.  Cranial  nerves are intact.   IMPRESSION:  1. A new  right lower lobe lesion with PET positive.  2. History of non-small cell lung cancer.  3. Chronic obstructive pulmonary disease.  4. Hypertension.  5. Diabetes mellitus type 2.   PLAN:  Right VATS, wedge resection of right lower lobe with node  sampling.      Ines Bloomer, M.D.  Electronically Signed     DPB/MEDQ  D:  02/20/2008  T:  02/21/2008  Job:  [id number]

## 2011-01-30 NOTE — Letter (Signed)
February 19, 2007   Genene Churn. Cyndie Chime, M.D.  [address] [facility]  [place], [place] [address]   Re:  ATIBA, KIMBERLIN                  DOB:  1929-01-11   Dear Dr. Cyndie Chime:   The patient came today.  His blood pressure is 142/78, pulse is 78,  respirations 18, temperature is 95 degrees.  Chest x-ray showed normal  postoperative changes.  He is doing well overall.  He is scheduled for a  CT scan in about 4 months.  He is now over 3 years since his surgery.  Since he has been stable, I will refer him back to you for long-term  follow-up.  I will be happy to see him again if he develops any future  problems.   Sincerely,   Ines Bloomer, M.D.  Electronically Signed   DPB/MEDQ  D:  02/19/2007  T:  02/19/2007  Job:  [id number]

## 2011-01-30 NOTE — Letter (Signed)
February 15, 2009   Genene Churn. Cyndie Chime, MD  [address] [facility]  [place], [place] [address]   Re:  JERICHO, ALCORN                  DOB:  02-21-1929   Dear Fayrene Fearing,   I saw the patient back in the office today.  His chest x-ray was stable.  His blood pressure was 146/81, pulse 65, respirations 18, and  saturations were 96%.  I will see him back again in 3 months when he  gets his next CT scan for followup.  He is still short of breath, but  overall stable.   Ines Bloomer, M.D.  Electronically Signed   DPB/MEDQ  D:  02/15/2009  T:  02/16/2009  Job:  [id number]

## 2011-01-30 NOTE — Assessment & Plan Note (Signed)
OFFICE VISIT   Jose Gordon, Jose Gordon  DOB:  02/12/1929                                        October 05, 2009  CHART #:  [id number]   The patient comes today, and his CT scan showed no evidence of  recurrence of his cancer.  Now, approximately 18 months since his last  surgery.  His blood pressure is 148/82, pulse 70, respirations 18, sats  were 98%.  I will see him back again in July after his next CT.   Ines Bloomer, M.D.  Electronically Signed   DPB/MEDQ  D:  10/05/2009  T:  10/06/2009  Job:  [id number]

## 2011-01-30 NOTE — Letter (Signed)
Feb 10, 2008   Genene Churn. Cyndie Chime, M.D.  [address] [facility]  [place]  [place] [address]   Re:  Jose Gordon, GALLARDO                  DOB:  Feb 16, 1929   Dear Rosanne Ashing:   I saw [name] back in the office today.  He has developed an  enlarged and spiculated lesion in the right lower lobe with an SUV of  2.3 on PET scan.  As you know, he previously had a large right upper  lobe lesion in which we did a right upper lobectomy and mediastinal  after mediastinoscopy.  The PET scan does show some mild enlargement of  his lymph nodes, but they were negative on PET.  I think repeat  mediastinoscopy on him would probably not be helpful, so my thought is  to do a re-do right thoracotomy and wedge resection of this right lower  lobe lesion and then I will re-sample the nodes including the  mediastinal nodes at that time.  He agrees with this plan and we plan to  do this on February 23, 2008 at [facility].   His pulmonary function tests showed an FVC of 2.97 with an FEV1 of 2.02  which is more than satisfactory for this procedure.  He agrees with the  plan and I will inform you of his admission.   Sincerely,   Ines Bloomer, M.D.  Electronically Signed   DPB/MEDQ  D:  02/10/2008  T:  02/10/2008  Job:  [id number]

## 2011-01-30 NOTE — Discharge Summary (Signed)
NAME:  Jose Gordon, Jose Gordon NO.:  [id number]   MEDICAL RECORD NO.:  [id number]          PATIENT TYPE:  INP   LOCATION:  2001                         FACILITY:  [facility]   PHYSICIAN:  Ines Bloomer, M.D. DATE OF BIRTH:  Feb 11, 1929   DATE OF ADMISSION:  02/24/2008  DATE OF DISCHARGE:  03/03/2008                               DISCHARGE SUMMARY   ADDENDUM   Mr. Hove was tentatively scheduled to be discharged on or about February 27, 2008.  He continued to make slow but adequate progress throughout  his recovery.  He did have episodes of sinus tachycardias including  multifocal atrial tachycardia with PACs.  His medications were adjusted,  specifically he was placed on beta blocker.  His labs were monitored  closely specifically electrolytes and he did require some potassium  replacement.  TSH was obtained and this measured 1.43 in the normal  range.  He stabilized from this regard and was deemed to be acceptable  for discharge on March 03, 2008, after evaluation by Dr. Edwyna Shell.   Medications on discharge were as follows;  1. Multivitamin one daily.  2. Calcium 600 plus D two tablets daily.  3. Glucosamine chondroitin two tablets daily.  4. Omeprazole 20 mg daily.  5. Doxazosin 8 mg half tablet nightly.  6. Aspirin 81 mg daily.  7. Alprazolam 0.5 mg two tablets at bedtime.  8. Albuterol inhaler twice daily as used previously.  9. Spiriva one dose daily.  10.Fentanyl patch 25 mcg q.3 day, transdermal patch.   INSTRUCTIONS:  The patient received written instructions in regard to  medications, activity, diet, wound care, and followup to include Dr.  Edwyna Shell on March 09, 2008.  Additionally, he would have a BMET drawn at  that time.  He was placed on a fluid restriction of 1500 mL for five  days.  For further details of this hospitalization, please see the  original dictated summary.   FINAL DIAGNOSES:  1. Right lower lobe squamous cell carcinoma.  2. History of squamous  cell carcinoma of the right lung status post      right upper lobectomy.  3. Hypertension.  4. Chronic obstructive pulmonary disease.  5. Prior history of tobacco abuse.      Jose Gordon, Jose Gordon.      Ines Bloomer, M.D.  Electronically Signed    WEG/MEDQ  D:  04/20/2008  T:  04/21/2008  Job:  [id number]

## 2011-02-02 NOTE — Op Note (Signed)
NAME:  Jose Gordon, Jose Gordon NO.:  [id number]   MEDICAL RECORD NO.:  [id number]                   PATIENT TYPE:  INP   LOCATION:  3012                                 FACILITY:  [facility]   PHYSICIAN:  Hewitt Shorts, M.D.            DATE OF BIRTH:  04-27-1929   DATE OF PROCEDURE:  08/18/2003  DATE OF DISCHARGE:  08/19/2003                                 OPERATIVE REPORT   POSTOPERATIVE DIAGNOSES:  1. 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  2. Lumbar spondylosis.  3. Lumbar degenerative disk disease.  4. Lumbar radiculopathy.   POSTOPERATIVE DIAGNOSES:  1. 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  2. Lumbar spondylosis.  3. Lumbar degenerative disk disease.  4. Lumbar radiculopathy.   PROCEDURES:  1. Right L3-4 lumbar laminotomy and foraminotomy with microdissection.   SURGEON:  Hewitt Shorts, M.D.   ASSISTANT:  Cristi Loron, M.D.   ANESTHESIA:  General endotracheal anesthesia.   INDICATIONS FOR PROCEDURE:  The patient is a 77 year old man who is status  post a previous L4-5 and L5-S1 decompression and arthrodesis who  subsequently has developed a right lumbar radiculopathy.  He was evaluated  with a myelogram plus a myelogram CT scan and found to have right L3-4  lateral recess stenosis due to a combination of spiny bony and ligamentous  overgrowth and the decision was made to proceed with a laminotomy and  foraminotomy for decompression.   PROCEDURE:  The patient was brought to the operating room and placed under  general endotracheal anesthesia.  The patient was turned to the prone  position and the lumbar region was prepped with Betadine soap and solution  and draped in sterile fashion. An x-ray was taken to localize the L3-4 level  and a midline incision made through the previous midline incision.  The line  of the incision was infiltrated with local anesthetic with epinephrine.  Dissection was carried down through the subcutaneous  tissue to the lumbar  fascia which was incised on the right side of the midline and the  paraspinous muscles were dissected from the spinous process and lamina in a  subperiosteal fashion.  Another x-ray was taken and the L3-4 intralaminar  space identified.  The ligamentous tissue within it was carefully removed.  The microscope was draped and brought onto the field to provide adequate  magnification and visualization.  The decompression was performed using  microdissection and microsurgical technique.  Using the Midas Rex drill the  laminotomy was performed along with Kerrison punches.  The ligamentum flavum  was thickened and in particularly, ventral to the inferior aspect of the L3  lamina.  This thickened ligament tissue was carefully removed from the  epidural space, compressing the thecal sac and nerve root.  The foramina was  examined and the L4 nerve root appeared to be well decompressed as was the  thecal sac.  The wound was irrigated with Bacitracin solution.  A check for  hemostasis was established and confirmed.  Decompression was reconfirmed.  We then proceeded with closure.  The deep fascia was closed with interrupted  undyed #1 Vicryl sutures and 2-0 Vicryl sutures.  The subcutaneous and  subcuticular layers were closed with interrupted inverted 2-0 Vicryl  sutures.  The skin was closed with Dermabond.  The wound was dressed with  Adaptic, sterile gauze and Hypo-Fix.  The patient tolerated the procedure  well.  The estimated blood loss was 125 mL.  Sponge count was correct.  Following surgery the patient was turned back to the supine position to be  reversed from anesthetic, extubated and transferred to the recovery room for  further care.                                               Hewitt Shorts, M.D.    RWN/MEDQ  D:  08/18/2003  T:  08/19/2003  Job:  [id number]

## 2011-02-02 NOTE — Op Note (Signed)
NAME:  Jose Gordon, Jose Gordon                      ACCOUNT NO.:  [id number]   MEDICAL RECORD NO.:  [id number]                   PATIENT TYPE:  INP   LOCATION:  2899                                 FACILITY:  [facility]   PHYSICIAN:  Ines Bloomer, M.D.              DATE OF BIRTH:  1929-03-27   DATE OF PROCEDURE:  10/18/2003  DATE OF DISCHARGE:                                 OPERATIVE REPORT   PREOPERATIVE DIAGNOSIS:  Right upper lobe mass.   POSTOPERATIVE DIAGNOSIS:  Nonsmall cell lung cancer right upper lobe.   PROCEDURE:  Right VATS, right thoracotomy, right upper lobectomy with node  dissection.   SURGEON:  Ines Bloomer, M.D.   DESCRIPTION OF PROCEDURE:  After percutaneous insertion of all monitoring  lines, the patient underwent general anesthesia and was turned to the right  lateral thoracotomy position and was prepped and draped in the usual sterile  fashion.  Two trocar sites were made in the anterior and posterior axillary  line at the 7th intercostal space.  Two trocars were inserted.  30 degree  scope was inserted.  The lesion was in the posterior segment of the right  upper lobe and it could be seen being stuck to the superior segment of the  right upper lobe.  Posterolateral thoracotomy incision was made and  latissimus was divided.  The serratus was reflected anterior.  The fifth  intercostal space was entered and two Tuffiers were placed at right angles.  Dissection was started in the fissure, dissecting out the pulmonary artery,  first dissecting out the posterior branch of the pulmonary artery which was  doubly ligated and divided with 2-0 silk.  Then using the EZ45 stapler, a  portion of the superior segment of the right lower lobe was resected and  dividing the fissure and freeing the mass off the lower lobe.  The rest of  the fissure was divided with the EZ45 stapler.  Several 11R and 10R nodes  were dissected free when we did the posterior dissection.   Dissection was  then carried around anteriorly and another 10R and 4R node were dissected,  dissecting up into the mediastinum and then the apical posterior branch to  the upper lobe was stapled and divided with the AutoSuture stapler.  Another  10R node was dissected free and then the superior pulmonary vein was  dissected out, looped with the vascular tape, stapled, and divided with the  AutoSuture stapler.   Attention was then turned to the minor fissure and this was stapled with the  TLC75 stapler and the EZ45 stapler.  Finally, the bronchus was stapled with  a TL30 stapler, divided distally, and the right upper lobe was removed.  The  inferior pulmonary ligament was taken down and several 9R nodes were  dissected free.  Two chest tubes were placed through the trocar sites and  tied in place with 0 silk.  A Marcaine block was  done in the usual fashion.  The chest was closed with three pericostals, #1 Vicryl in the muscle layer,  and 2-0 Vicryl in the subcutaneous tissue.  Underneath the pericostals, the  On-Q catheters were placed in the usual fashion.  The subcutaneous tissues  were closed with 2-0 Vicryl and the skin with Ethicon skin clips.  The  patient returned to the recovery room in stable condition.                                               Ines Bloomer, M.D.   DPB/MEDQ  D:  10/18/2003  T:  10/18/2003  Job:  [id number]   cc:   Charlaine Dalton. Sherene Sires, M.D. [facility]

## 2011-02-02 NOTE — Op Note (Signed)
NAME:  DEJOUR, VOS NO.:  [id number]   MEDICAL RECORD NO.:  [id number]                   PATIENT TYPE:  INP   LOCATION:  3036                                 FACILITY:  [facility]   PHYSICIAN:  Hewitt Shorts, M.D.            DATE OF BIRTH:  [birth year]   DATE OF PROCEDURE:  12/03/2002  DATE OF DISCHARGE:                                 OPERATIVE REPORT   PREOPERATIVE DIAGNOSIS:  Left L4-5 lumbar disk herniation, lumbar stenosis,  lumbar spondylosis and lumbar radiculopathy.   POSTOPERATIVE DIAGNOSIS:  Left L4-5 lumbar disk herniation, lumbar stenosis,  lumbar spondylosis and lumbar radiculopathy.   PROCEDURE:  L4 laminectomy, left L4-5 lumbar microdiskectomy, left L4-5  transverse posterior lumbar interbody fusion with split femoral ring  allograft and VITOSS with  bone marrow aspirate and a bilateral L4-5  posterolateral arthrodesis with 90 degree posterior instrumentation, VITOSS  and  bone marrow aspirate.   SURGEON:  Hewitt Shorts, M.D.   ASSISTANT:  Coletta Memos, M.D.   ANESTHESIA:  General endotracheal anesthesia.   INDICATIONS:  The patient is a 77 year old man who underwent an __________  procedure and L5-S1 posterior lumbar interbody fusion and a L5-S1  posterolateral arthrodesis with posterior instrumentation about 3 months  ago. The patient did well initially but then developed left lumbar radicular  pain. An MRI revealed a left L4-5 lumbar disk herniation with a fragment  that had migrated rostrally behind the body of L4 as well as marked stenosis  at the L4-5 level. Due to spondylosis, the decision was made to re-explore  this level, to proceed with decompression and arthrodesis.   PROCEDURE:  The patient was brought to the operating room and placed under  general endotracheal anesthesia. The patient was  turned to the prone  position. The lumbar region was prepped with Betadine soap and solution and  draped in a sterile  fashion.   The midline was infiltrated with 1% __________  epinephrine and a midline  incision was made and carried down through the subcutaneous tissue with  bipolar cautery and electrocautery was used to maintain hemostasis.  Dissection was carried down to the lumbar fascia which was incised in the  midline where the previous laminectomy had been performed and at the site of  the spinous process more rostrally.   This exposure was first carried out rostrally with a subperiosteal elevation  of the paraspinal muscles. The self-retaining retractor was placed and then  we more carefully dissected down through the scar tissue overlying the  previous laminectomy and Gil procedure. We carried the dissection out  laterally exposing the hardware at L5 and S2 and an x-ray was taken. We were  able to identify the L3 and L4 spinous processes and lamina. There was  extensive scar tissue on the inferior margin of the L4 lamina and spinous  process and this was carefully dissected from the  thecal sac.   The  microscope was then draped and brought into the field to provide  sufficient magnification, illumination and visualization. We were able to  further dissect the scar tissue laterally into the lateral recesses. We were  able to identify the L4 and L5 nerve roots.   We then proceeded with a laminectomy at L4 using the [facility] Max drill and  Kerrison punches. The annulus of the L4-5 disk was identified bilaterally  and on examining epidural space on the left side were able to identify the  disk herniation that had extruded from the disk space rostrally behind the  body of the L4. This fragment was removed.   We then further incised the annulus and entered into the disk space and  proceeded with a thorough diskectomy using a variety of pituitary rongeurs  and microcurets. We further prepared the disk space with the bone craft  system. The endplates of the corresponding vertebrae were carefully  removed  and we selected a 12-mm in height ring of femoral allograft.   It was cut using an oscillating saw and shaped with the [facility] drill, and  the graft was carefully tamped into position while gently retracting the  thecal sac and nerve roots with the graft being countersunk within the disk  space. Later VITOSS that had been soaked in bone marrow aspirate was packed  around the graft in the graft in the disk space.   The C-arm fluoroscopy unit was then draped and brought into the field. We  visualized the L4 vertebra. Posterior pedicle entry sites were identified  and overlying bone was removed using the [facility] Max drill and double action  rongeurs. Using the [facility] drill the cortex was perforated and we were  able to pass a pedicle probe down through the pedicle into the vertebral  body. With a good posterolateral to anteromedial trajectory. Bone marrow was  aspirated from the vertebral body and injected over the 30 mL of VITOSS. A  total of  30 mL of bone marrow was aspirated.   We then tapped the pedicle with a 5.25 tap. It was then examined with a ball  probe. Good threading was noted and no cutouts were noted. We then placed a  6.75 x 45 mm screw on each side. We then removed the caps and rods from the  previous construct. These were not reused. Instead a 50-mm rod was used on  the left and a 60-mm rod cut down to 55 mm was used on the right. Each rod  was contoured with a rod bender and then secured to the 3 pedicle screws  with locking caps. All 6 caps were fully tightened against the counter  torque.   A cross-link was then applied and tightened and then we exposed the  transverse process of L4 and the fusion mass that had been present at L5 and  S1, and the VITOSS was packed in the lateral gutter in the intra-transverse  space and over the transverse process and fusion mass.  We the proceeded with closure. The paraspinal muscles were approximated with  interrupted  undyed 1 Vicryl suture. The deep fascia with interrupted undyed  1 Vicryl suture. The subcutaneous and subcuticular were closed with  interrupted inverted 2-0 undyed Vicryl sutures. The skin was reapproximated  with Dermabond.   The patient tolerated the procedure well. The estimated blood loss was 500  mL. The patient was given back 300 mL of Cell Saver blood. Sponge and needle  counts were correct. Following  the surgery the patient was  turned back to  the supine position to be reversed from his anesthetic, extubated and  transferred to the recovery room for further care.                                               Hewitt Shorts, M.D.    RWN/MEDQ  D:  12/03/2002  T:  12/04/2002  Job:  [id number]

## 2011-02-02 NOTE — Discharge Summary (Signed)
   NAME:  Jose Gordon, CHIRIBOGA NO.:  [id number]   MEDICAL RECORD NO.:  [id number]                   PATIENT TYPE:  INP   LOCATION:  3036                                 FACILITY:  [facility]   PHYSICIAN:  Danae Orleans. Venetia Maxon, M.D.               DATE OF BIRTH:  1928/12/04   DATE OF ADMISSION:  12/03/2002  DATE OF DISCHARGE:  12/05/2002                                 DISCHARGE SUMMARY   REASON FOR ADMISSION:  1. Lumbar disk herniation.  2. Lumbar stenosis.  3. Spondylosis.  4. Lumbar radiculopathy.   FINAL DIAGNOSES:  1. Lumbar disk herniation.  2. Lumbar stenosis.  3. Spondylosis.  4. Lumbar radiculopathy.   HISTORY OF PRESENT ILLNESS:  The patient is a 77 year old man who underwent  prior L5-S1 interbody fusion who then had developed subsequently a herniated  disk at L4-5 and required re-exploration with fusion and decompression at  the L4-5 level.   HOSPITAL COURSE:  He did well in his procedure, was gradually up and  mobilized.  He was doing well on March 20 and able to be discharged home  with instructions of no lifting, bending, twisting, or driving.   FOLLOW UP:  Hewitt Shorts, M.D. in three weeks with x-ray.   DISCHARGE MEDICATIONS:  Percocet.                                               Danae Orleans. Venetia Maxon, M.D.    JDS/MEDQ  D:  12/05/2002  T:  12/06/2002  Job:  [id number]

## 2011-02-02 NOTE — Discharge Summary (Signed)
NAME:  Jose Gordon, MARTINE                      ACCOUNT NO.:  [id number]   MEDICAL RECORD NO.:  [id number]                   PATIENT TYPE:  INP   LOCATION:  2030                                 FACILITY:  [facility]   PHYSICIAN:  Ines Bloomer, M.D.              DATE OF BIRTH:  1929-05-01   DATE OF ADMISSION:  10/18/2003  DATE OF DISCHARGE:  10/25/2003                                 DISCHARGE SUMMARY   HISTORY OF PRESENT ILLNESS:  This is a 77 year old male who was referred by  Dr. Casimiro Needle B. Wert to Dr. Edwyna Shell for evaluation of a right upper lobe mass.  The patient has a long standing history of tobacco abuse although he has  quit in December of 2004. Use has been extensive up to an average of three  packs per day over a 40-year time frame. The patient was being evaluated by  chest x-ray for preoperative assessment prior to back surgery, and at that  time, a mass was noted on routine chest x-ray. This was confirmed by CT scan  to show a 1.6 x 1.5 cm spiculated mass with associated prominent hilar lymph  nodes. He was seen by Dr. Edwyna Shell who subsequently performed a fiberoptic  bronchoscopy and mediastinoscopy on October 06, 2003 which was found to be  negative for malignancy. He was admitted this hospitalization for surgical  resection.   PAST MEDICAL HISTORY:  1. Gastroesophageal reflux disease.  2. History of esophageal strictures followed by Dr. Barbette Hair. Arlyce Dice.  3. History of hypertension.  4. History of diverticulosis.  5. History of arthritis.  6. Chronic back pain.   PAST SURGICAL HISTORY:  1. Lumbar back surgery x3.  2. History of left knee surgery.  3. History of vasectomy.  4. History of TURP/prostate surgery.  5. History of cataract surgery x3.   MEDICATIONS ON ADMISSION:  1. Protonix 40 mg daily.  2. Chlorthalidone 25 mg q.d.  3. Doxazosin 8 mg q.d.  4. Avodart 0.5 mg q.d.  5. Aleve 500 mg p.o. q.d.  6. Gabapentin 300 mg t.i.d.  7. Oxycodone 5/325 mg q.d.  8.  Multivitamin one q.d.  9. Calcium +D two p.o. q.d.  10.      Aspirin 81 mg q.d.   ALLERGIES:  The patient has had reaction to IV DYE with a rash. Subsequent  to that, he has had premedication for any dye studies.   Family history, social history, review of systems, past physical exam,  please see the history and physical done at the time of admission.   HOSPITAL COURSE:  The patient was admitted electively on October 18, 2003,  taken to the operating room where he underwent the following procedure:  Right video-assisted thoracoscopy, right thoracotomy, right upper lobe  lobectomy, and lymph node dissection. The procedure was performed by D.  Karle Plumber, M.D., and the patient tolerated the procedure well and was  taken to the post anesthesia  care unit in stable condition.   Postoperative hospital course:  The patient has done quite well. He has  maintained stable hemodynamics. All routine lines, monitors, and drainage  devices were discontinued in a standard fashion. He did have a somewhat  prolonged air leak; however, this did heal with time, and the chest tubes  were discontinued. The patient has shown good signs of healing without signs  of infection in regards to his incisions. His rehabilitation has been  unremarkable. He has been seen by physical therapy and advanced in a routine  manner, commiserate for level of postoperative convalescence. His oxygen has  been weaned. He maintains good saturations on room air. The patient is  undergoing a staging evaluation and oncology consultation has been obtained  with Dr. Cyndie Chime. Pathology reveals poorly differential squamous cell  carcinoma 2.3 cm through the pleural, focally invading adjacent lung tissue,  but final surgical margins were all negative. Lymph nodes taken including 4,  9, 10, and 11 were negative for tumor. He is staged as a I-b T2 N0 MX. Dr.  Patsy Lager initial inclination is for observation alone with no   chemotherapy, and he will follow up as an outpatient for further discussion.  Please see his dictated note for further details. The patient has undergone  bone scan with results currently pending to the chart. CT of the brain has  also been done, but again, result is pending. Currently, the patient is felt  to be quite stable, and tentatively, he is felt to be a candidate for  discharge in the morning of October 26, 2003 pending morning round  reevaluation.   MEDICATIONS AT DISCHARGE:  As preoperatively. Additionally, Tylox one to two  every four to six hours as needed for pain.   INSTRUCTIONS:  The patient is instructed not to drive until further notice.  He is to increase his walking as able with no heavy lifting, pushing, or  pulling with his arms.  The patient may shower and clean his incisions  gently with soap and water.   DIET:  There is no change in his diet from preoperative.   FOLLOW UP:  The patient will see Dr. Edwyna Shell on February 15 at 3:45 with a  chest x-ray from the [facility].   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   Other diagnoses include:  1. Gastroesophageal reflux.  2. Esophageal strictures.  3. Hypertension.  4. History of diverticulosis.  5. Previous surgeries as outlined above.   CONDITION ON DISCHARGE:  Stable and improved.      Rowe Clack, P.A.-C.                    Ines Bloomer, M.D.    Sherryll Burger  D:  10/25/2003  T:  10/26/2003  Job:  [id number]   cc:   Barbette Hair. Arlyce Dice, M.D. [facility]   Hewitt Shorts, M.D.  [address]  [place]  [place] [address]  Fax: [phone]   Charlaine Dalton. Sherene Sires, M.D. [facility]   Gaspar Garbe, M.D.  [address]  [place]  [place] [address]  Fax: [phone]

## 2011-02-02 NOTE — Consult Note (Signed)
NAME:  COLEY, KULIKOWSKI NO.:  [id number]   MEDICAL RECORD NO.:  [id number]                   PATIENT TYPE:  INP   LOCATION:  3315                                 FACILITY:  [facility]   PHYSICIAN:  Genene Churn. Cyndie Chime, M.D.          DATE OF BIRTH:  [birth year]   DATE OF CONSULTATION:  10/22/2003  DATE OF DISCHARGE:                                   CONSULTATION   This is a medical oncology consultation to evaluate this man for newly  diagnosed squamous cell carcinoma of the lung.   Jose Gordon is a pleasant 77 year old man who smoked three packs of  cigarettes daily for most of his life.  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  He was found to  have a right upper lobe lung mass on a routine chest radiograph done prior  to planned back surgery back in November 2004.  A CT scan of the chest done  August 17, 2003, confirmed the presence of a 1.6 x 1.5 cm spiculated  lesion in the right upper lobe consistent with a primary lung carcinoma.  There were mildly prominent mediastinal and bihilar lymph nodes.  Largest  lymph node 1.7 x 0.7 cm.  Cuts through the upper abdomen showed mild diffuse  fatty infiltration of the liver without obvious tumor.   The patient was referred to Dr. Karle Plumber.  He underwent bronchoscopy  with mediastinoscopy on October 06, 2003.  Three mediastinal lymph nodes  sampled were negative for tumor.  He was readmitted here on October 18, 2003, and underwent a right VATS with right upper lobectomy and lymph node  dissection.  Pathology shows a poor differentiated squamous cell carcinoma,  2.3 cm in maximum diameter invading through the pleura and focally invading  the adjacent lung tissue.  However, final surgical margins are all negative.  Multiple lymph nodes at levels 4, 9, 10, and 11 are negative for tumor.  (Stage IB, T2, N0, M0).   PAST MEDICAL HISTORY:  1. Back problems  with surgery x3 by Dr. Newell Coral.  2. Left knee surgery in the past.  3. TURP for BPH.  4. Cataract surgery.  5. Vasectomy.  6. GERD.  7. Degenerative arthritis.  8. He does have hypertension.   MEDICATIONS ON ADMISSION:  1. Protonix 40 mg daily.  2. Chlorthalidone 25 mg daily.  3. Doxazosin 8 mg daily.  4. Naprosyn 500 mg daily.  5. Neurontin 300 mg daily.  6. Avodart 0.5 mg daily.   ALLERGIES:  No known drug allergies.   FAMILY HISTORY:  Noncontributory.  As I recall, one of his brothers had lung  cancer.   SOCIAL HISTORY:  Married, no children.  Heavy smoker right up until December  2004, positive asbestos exposure.   PHYSICAL EXAMINATION:  NECK:  There is a right internal jugular line in  place.  No left cervical  and no supraclavicular or axillary lymphadenopathy.  CHEST:  The left chest is clear.  There is a right chest tube in place.  CARDIOVASCULAR:  Regular rhythm without murmur.  ABDOMEN:  Soft, no mass, no organomegaly.  EXTREMITIES:  No edema.  NEUROLOGIC:  Complete neurologic not done at this time.   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   Although technically this is a 1B lesion due to pleural invasion, overall  the tumor is small and all surgical margins are negative.  My inclination in  a patient of this age is observation alone with no chemotherapy.  As you  know only recently have we seen clinical trials showing a slight advantage  in survival of about 5% in patients with stage 1B and 2 non-small-cell lung  cancers receiving adjuvant chemotherapy following surgery.  I think that we  still need to individualize the patients to whom we offer such therapy.   I will arrange for outpatient follow-up for further discussion.   Thank you for this consultation.                                               Genene Churn. Cyndie Chime, M.D.    Lottie Rater  D:  10/24/2003  T:  10/25/2003  Job:  [id number]    cc:   Gaspar Garbe, M.D.  [address]  [place]  [place] [address]  Fax: [phone]   Charlaine Dalton. Sherene Sires, M.D. [facility]   Hewitt Shorts, M.D.  [address]  [place]  [place] [address]  Fax: [phone]

## 2011-02-02 NOTE — H&P (Signed)
NAME:  Jose Gordon, Jose Gordon                      ACCOUNT NO.:  [id number]   MEDICAL RECORD NO.:  [id number]                   PATIENT TYPE:  INP   LOCATION:  NA                                   FACILITY:  [facility]   PHYSICIAN:  Ines Bloomer, M.D.              DATE OF BIRTH:  [birth year]   DATE OF ADMISSION:  10/18/2003  DATE OF DISCHARGE:                                HISTORY & PHYSICAL   CHIEF COMPLAINT:  Right upper lobe mass found on chest x-ray.   HISTORY OF PRESENT ILLNESS:  This is a 77 year old male who was referred by  Dr. Casimiro Needle B. Wert to Dr. Ines Bloomer for evaluation of a right upper  lobe mass.  The patient has a longstanding history of tobacco abuse,  although he has quit in December of 2004.  Use has been extensive up to an  average of three packs per day over a 40 year time frame.  The patient was  being evaluated by chest x-ray for preoperative assessment prior to back  surgery and at that time a mass was noted on routine chest x-ray. This was  confirmed on CT scan to show a 1.6 by 1.5 spiculated mass with associated  prominent hilar lymph nodes.  He was seen by Dr. Ines Bloomer who  subsequently performed fiberoptic bronchoscopy and mediastinoscopy on  October 06, 2003 which was found to be negative for malignancy.   The patient admits to cough with sputum production.  He denies fevers,  chills, or unexplained weight loss.  He does have occasional shortness of  breath and especially dyspnea on exertion but no paroxysm nocturnal dyspnea,  chest pain, arrhythmias, peripheral edema, hemoptysis, signs and symptoms of  CVA, history of pulmonary embolus, or deep vein thrombosis.   Office spirometry revealed the FVC to be 3.79 in the FEV1 of 2.60.  It was  Dr. Roylene Reason. Burney's recommendation that the patient proceed with surgical  resection with video-assisted thoracoscopy and probable right upper lobe  lobectomy.  He is admitted this hospitalization for the  procedure.   PAST MEDICAL HISTORY:  1. Gastroesophageal reflux disease.  2. History of esophageal strictures followed by Dr. Barbette Hair. Arlyce Dice.  3. History of  hypertension.  4. History of diverticulosis.  5. History of arthritis.  6. Severe chronic back pain.   PAST SURGICAL HISTORY:  1. Lumbar back surgery x 3.  2. History of left knee surgery.  3. History of vasectomy.  4. History of TURP/prostate surgery.  5. History of cataract surgery x 3.   CURRENT MEDICATIONS:  1. Protonix 40 mg q.d.  2. Chlorthalidone 25 mg q.d.  3. Doxazosin 8 mg q.d.  4. Avodart 0.5 mg q.d.  5. Aleve 500 mg q.d.  6. Gabapentin 300 mg t.i.d.  7. Oxycodone 5/325 mg q.d.  8. Multivitamin 1 q.d.  9. Calcium plus D 2 q.d.  10.  Aspirin 81 mg q.d.   ALLERGIES:  The patient has had a reaction to IV DYE with a rash.  Subsequent to that, he has required premedication for any dye studies.   REVIEW OF SYMPTOMS:  Please see the history of present illness for  significant positives.  Otherwise, there is some occasional dysphagia, some  occasional difficulty with urination, primarily in the starting of his  stream but he denies other review of systems.   FAMILY HISTORY:  His father deceased at age 62 and had some arthritis  difficulties.  The patient has four brothers who have cancer and one brother  who is deceased from myocardial infarction at age 71.   SOCIAL HISTORY:  He is married with no children.  He has heavy tobacco use.  History as described above with cessation of use on September 08, 2003.   PHYSICAL EXAMINATION:  VITAL SIGNS:  Blood pressure 132/78, pulse 68,  respirations 16, unlabored.  GENERAL:  This is a 77 year old male who is in no acute distress.  Alert and  oriented x 3.  He does have some difficulty secondary to his chronic back  pain with mobilization.  HEENT:  Normocephalic and atraumatic.  Pupils are equal, round, and reactive  to light, although the right pupil is distorted  from previous surgery.  Extraocular movements intact.  NECK:  Supple.  No jugular venous distention. No bruits.  No  lymphadenopathy.  CHEST:  Symmetrical on inspiration.  No wheezes.  No rhonchi.  There is  overall diminished air exchange throughout all the fields.  CARDIOVASCULAR:  Regular rate and rhythm.  No murmurs, rubs, or gallops.  ABDOMEN:  Moderately obese, soft, nontender.  Normoactive bowel sounds.  No  palpable masses or bruits.  GENITOURINARY:  Deferred.  EXTREMITIES:  There is no clubbing but he does have brittle nails with  linear vertical pattern.  No cyanosis and no edema.  No peripheral  ulcerations.  SKIN:  Warm to touch.  PULSES:  Peripheral pulses are equal and intact bilaterally.  NEUROLOGICAL:  He is somewhat unsteady.  He does have significant back pain,  although none to palpation.  The patient does perform a maneuver in getting  in and out of the bed where he uses gentle pressure against his lumbar  coccygeal region and this helps dissipate the pain sensation.  He is  generally somewhat weak and deconditioned.   ASSESSMENT:  1. Right lung mass necessitating surgical resection.  2. Other diagnoses include as previously listed gastroesophageal reflux     disease.  3. Previous history of esophageal strictures and dilatation.  4. History of hypertension.  5. History of diverticulosis.  6. Chronic back pain, status post multiple procedures.  7. History of left knee surgery.  8. History of vasectomy.  9. History of transesophageal resection of the prostate.  10.      History of other prostate surgery.  11.      History of cataract surgery.   PLAN:  History of video-assisted thoracoscopy with right upper lobe  resection.      Rowe Clack, P.A.-C.                    Ines Bloomer, M.D.    Sherryll Burger  D:  10/14/2003  T:  10/14/2003  Job:  [id number]   cc:   Ines Bloomer, M.D.  [address]  [place] [address]   Gaspar Garbe, M.D.   [address]  [place]  [place] [address]  Fax: [phone]   Charlaine Dalton. Sherene Sires, M.D. [facility]   Hewitt Shorts, M.D.  [address]  [place]  [place] [address]  Fax: [phone]

## 2011-02-02 NOTE — Op Note (Signed)
NAME:  JUANYA, VILLAVICENCIO                      ACCOUNT NO.:  [id number]   MEDICAL RECORD NO.:  [id number]                   PATIENT TYPE:  INP   LOCATION:  3012                                 FACILITY:  [facility]   PHYSICIAN:  Coletta Memos, M.D.                  DATE OF BIRTH:  05-20-1929   DATE OF PROCEDURE:  08/18/2003  DATE OF DISCHARGE:  08/19/2003                                 OPERATIVE REPORT   PREOPERATIVE DIAGNOSIS:  Wound infection.   POSTOPERATIVE DIAGNOSIS:  Old liquefied hematoma.   INDICATIONS FOR PROCEDURE:  Mr. Bribiesca is a gentleman whom I took to the  operating room approximately a week ago.  He has had a significant amount of  drainage from his wound.  He has not been febrile.  He has not had any  neurologic problems.  He has no obvious signs of infection.  I am concerned  about the amount of drainage and the duration.  I, therefore, elected to  return him to the operating room for a wound exploration.   PROCEDURE:  Wound exploration.   SURGEON:  Coletta Memos, M.D.   ANESTHESIA:  General endotracheal anesthesia.   OPERATIVE NOTE:  Mr. Mendolia was brought to the operating room, intubated,  and placed under general anesthesia without difficulty.  He was rolled prone  onto a Wilson frame and all pressure points were properly padded.  His back  was prepped and he was draped in a sterile fashion.  I opened the inferior  3/4 of his old incision.  After opening, there was a small amount, less than  20 mL, of liquefied hematoma there.  I then irrigated 1 1/2 liters of saline  and Bacitracin solution into the wound.  There was not any infection.  There  was no CSF emanating from the wound.  I was down below the fascia and there  was no foul smell or anything else to indicate infection.  I then closed the  wound in a layered fashion.  I closed the skin with vertical interrupted  mattress sutures.                                               Coletta Memos, M.D.    KC/MEDQ  D:  08/18/2003  T:  08/19/2003  Job:  [id number]

## 2011-02-02 NOTE — Op Note (Signed)
NAME:  Jose Gordon, HESLIN NO.:  [id number]   MEDICAL RECORD NO.:  [id number]                   PATIENT TYPE:  OUT   LOCATION:  CATS                                 FACILITY:  [facility]   PHYSICIAN:  Hewitt Shorts, M.D.            DATE OF BIRTH:  [birth year]   DATE OF PROCEDURE:  07/23/2002  DATE OF DISCHARGE:                                 OPERATIVE REPORT   PREOPERATIVE DIAGNOSES:  1. Bilateral L5 partial intra-articularis defect.  2. L5-S1 grade 1-2 spondylolisthesis.  3. L5-S2 dorsal synovial cyst.  4. Spondylitic disk herniation, degenerative disk disease, spondylosis and     radiculopathy.   POSTOPERATIVE DIAGNOSES:  1. Bilateral L5 partial intra-articularis defect.  2. L5-S1 grade 1-2 spondylolisthesis.  3. L5-S2 dorsal synovial cyst.  4. Spondylitic disk herniation, degenerative disk disease, spondylosis and     radiculopathy.   PROCEDURE:  L5 Gill procedure, resection of synovial cyst, bilateral L5-S1  lumbar microdiskectomy with posterior lumbar interbody fusion with split  femoral ring allograft and V-toss with bone marrow aspirate and posterior  lateral arthrodesis with ____ degree posterior instrumentation and V-toss  with bone marrow aspirate.   SURGEON:  Hewitt Shorts, M.D.   ASSISTANT:  R.N.   ANESTHESIA:  General endotracheal.   INDICATIONS:  The patient is a 77 year old man who presented with low back  pain plus lumbar radiculopathy.  He was found by MRI scan to have stenosis  at the L5-S1 level due to multiple factors including a grade 1-2  spondylolisthesis of L5-S1 due to bilateral L5 pars intra-articularis  defects with a dorsal synovial cyst at this level and bilateral foraminal  encroachments.  Decision was made to proceed with decompression and  arthrodesis.  Decision was made to proceed with decompression and  arthrodesis.   DESCRIPTION OF PROCEDURE:  The patient was brought to the operating room and  placed  under general endotracheal anesthesia.  The patient was turned to the  prone position.  Lumbar region was prepped with Betadine soap and solution  and draped in a sterile fashion.  The midline incision was infiltrated with  a local anesthetic with epinephrine.  An x-ray was taken and the L5-S1 level  identified.  A midline incision was made, carried down through the  subcutaneous tissue with bipolar cautery.  Electrocautery was used to  maintain hemostasis.  Dissection was carried down to the lumbar fascia which  was incised bilaterally and the paraspinal muscles were then stripped to the  spinous process and lamina in a subperiosteal fashion.  The x-ray was taken.  The L5-S1 intralaminar space was identified and then we proceeded with an L5  Gill procedure resecting the spinous process of L5 with double action  rongeur.  The laminectomy was performed using the black Max drill, double  action rongeurs with Kerrison punches and then the mobile fragment of the  posterior elements was dissected from the surrounding pseudoarthrotic  spondylitic overgrowth and removed.  There was moderate stenosis at the L4-  L5 level and an inferior L4 laminectomy was performed so as to be able to  remove all of the L5 posterior element as well as to remove thickened  ligament of flavum and spondylitic overgrowth.  We did encounter the  synovial cyst seen dorsally.  Much of it was adherent to the thecal sac.  That portion which could not be freed up was left attached but the great  majority of the cyst itself was removed, and the thecal sac was well  decompressed.  The microscope was then draped and brought into the field to  provide instrument magnification, illumination and visualization, and we  then proceeded with carefully dissecting the pseudoarthrosis off of the  thecal sac and L5 nerve roots bilaterally.  This was removed and the nerve  roots decompressed.  We then identified the L5-S1 annulus  bilaterally.  The  annulus was incised, and the disk space entered.  There was significant  spondylitic overgrowth from the posterior aspect of S1 with a large lip that  was removed using a osteotome as well as Kerrison punches.  The L5-S1 disk  was markedly spondylitic and the spondylitic degenerative disk material was  removed.  There was significant disk protrusion laterally within the  foramen, bilaterally compressing the L5 nerve roots, and this was likewise  carefully dissected away from the nerve roots and removed so as to  decompress the nerve roots.  We then proceeded with preparing the  intravertebral disk space for arthrodesis.  The spondylitic degenerated  cartilaginous end-plates were removed using various curets.  We were able to  take samples of bony surface on both the L5 and S1 vertebra.  We then  prepared a interbody graft soaked in saline.  A 10 mm in height segment of  vein using oscillating saws as well as the [facility] Max drill was cut and  shaped for bilateral posterior lumbar interbody fusion grafts.  The medial  position and canthal position between the L5 and S1 vertebrae were  countersunk just below the dorsal aspect of L5 to the body.  Once both  grafts were in position we exposed the transverse process of L5 bilaterally  as well as the ala of the sacrum.  We identified the pedicle entry sites and  then using direct visualization as well as C-arm fluoroscopy we were able to  place pedicle screws in the pedicles of L5 and S1 bilaterally.  Each site  was carefully probed with the pedicle probe, subsequently tapped, checked  with a the [employer].  No cut offs were found in any sites. We  placed 6.75 x 45 mm screw at the sacrum and a 6.75 x 50 mm screw at L5.  Once all four screws were replaced the lateral gutters were serially  prepared with decortication of the L5 transverse processes and ala and V- toss which had been soaked in the bone marrow aspirate which  had been  aspirated through the pedicle at the S1 level was packed into the lateral  gutter.  We took additional V-toss soaked in bone marrow aspirate and tacked  it in intravertebral disk space around the fifth graft.  Care was taken to  leave the L5 and S1 nerve roots as well as the thecal site noncompressed by  any of this bone graft material.  We then selected 40 mm rods.  Locking caps  were placed and full tightened.  Once hemostasis  was established we  proceeded with closure.  The paraspinal muscles were approximated  interrupted undyed 1 Vicryl suture,  The deeper fascia was closed with  undyed 1-0 Vicryl sutures.  The deep fascia was closed with 1-0 Vicryl  suture and the subcutaneous and subcuticular layer closed with interrupted  inverted 2-0 Vicryl suture.  The skin was reapproximated with DermaBond.  The patient tolerated procedure well.  The estimated blood loss was 1200 cc.  The patient was given back 500 cc of Cell Saver blood.  Sponge and needle  counts correct.                                               Hewitt Shorts, M.D.    RWN/MEDQ  D:  07/23/2002  T:  07/23/2002  Job:  [id number]

## 2011-02-02 NOTE — H&P (Signed)
NAME:  Jose Gordon, Jose Gordon NO.:  [id number]   MEDICAL RECORD NO.:  [id number]                   PATIENT TYPE:  INP   LOCATION:  3036                                 FACILITY:  [facility]   PHYSICIAN:  Hewitt Shorts, M.D.            DATE OF BIRTH:  [birth year]   DATE OF ADMISSION:  12/03/2002  DATE OF DISCHARGE:                                HISTORY & PHYSICAL   HISTORY OF PRESENT ILLNESS:  The patient is a 77 year old right-handed white  male who is about three months status post an L5 Gill procedure, bilateral  L5-S1 diskectomy and posterior lumbar interbody fusion and posterolateral  arthrodesis with posterior instrumentation and bone graft.  He did well  following surgery, but about two months afterwards, he developed increasing  pain in the low back into the buttocks bilaterally and into the left lower  extremity.  He was treated initially with medications and then restudied  with MRI scan and this revealed stenosis at the L4-5 level with a left L4-5  lumbar disk herniation that had migrated on the left side drastically behind  the body of L4 and after he did not respond to analgesics and Neurontin, a  decision was made to decompress the L4-5 level and extend the arthrodesis to  include that level as well.   PAST MEDICAL HISTORY:  Past medical history is notable for a history of  hypertension, but no history of myocardial infarction, cancer, stroke,  diabetes or lung disease.   PAST SURGICAL HISTORY:  Previous surgery includes his lumbar surgery in  November of 2003, prostate surgery, nephrectomy, cataract surgery and left  knee surgery.   ALLERGIES:  He report an allergy to INTRATHECAL CONTRAST.   MEDICATIONS:  1. Chlorthalidone 25 mg daily.  2. Doxazosin 8 mg daily.  3. Prevacid 30 mg daily.  4. Naprosyn 500 mg daily.  5. Calcium 600 mg with vitamin D two per day.  6. Vicodin for pain.  7. Flexeril.   FAMILY HISTORY:  His parents have  both passed on.  There is a family history  of arthritis, cancer and heart disease.   SOCIAL:  The patient is married.  He is retired from the railroad.  He  smokes a pack a day and has been smoking for 40 years.  He does not drink  alcoholic beverages.  He denies a history of substance abuse.   REVIEW OF SYSTEMS:  Review of systems are notable for those difficulties  described in history of present illness and past medical history but is  otherwise unremarkable.   PHYSICAL EXAMINATION:  GENERAL:  Patient is a well-developed, well-nourished  white male in no distress.  VITAL SIGNS:  His temperature is 96.8, pulse is 84, blood pressure 130/89,  respiratory rate 20, height 6 feet 1 inches, weight 257 pounds.  LUNGS:  Lungs are clear to auscultation.  He has symmetrical respiratory  excursion.  HEART:  Heart  has a regular rate and rhythm, normal S1 and S2 with no  murmur.  ABDOMEN:  Abdomen is soft and nontender.  Bowel sounds are present.  EXTREMITIES:  Examination shows no clubbing, cyanosis, or edema.  NEUROLOGIC:  Examination shows full strength.   IMPRESSION:  1. Lumbar stenosis.  2. Left L4-5 lumbar disk herniation.  3. Lumbar spondylosis.  4. Lumbar radiculopathy.   PLAN:  The patient is being admitted for an L4 laminectomy, a left L4-5  diskectomy and interbody fusion and a bilateral L4-5 posterolateral  arthrodesis with extension of the posterior instrumentation from L4 to S1  (that is, with removal of the previous caps and rod from L5 to S1 and bone  graft).   I had an opportunity to speak with the patient and his wife on several  occasions at the office regarding the nature of the procedure, the  indications for such, typical length of surgery. hospital stay and overall  recuperation, need for postoperative immobilization in a lumbar corset and  risks of surgery; understanding all of this, he wishes to proceed.                                               Hewitt Shorts, M.D.    RWN/MEDQ  D:  12/04/2002  T:  12/05/2002  Job:  [id number]

## 2011-02-02 NOTE — H&P (Signed)
NAME:  Jose Gordon, Jose Gordon NO.:  [id number]   MEDICAL RECORD NO.:  [id number]                   PATIENT TYPE:  OUT   LOCATION:  CATS                                 FACILITY:  [facility]   PHYSICIAN:  Hewitt Shorts, M.D.            DATE OF BIRTH:  09-Feb-1929   DATE OF ADMISSION:  07/23/2002  DATE OF DISCHARGE:                                HISTORY & PHYSICAL   HISTORY OF PRESENT ILLNESS:  The patient is a 77 year old right-handed white  male who was evaluated for a left lumbar radiculopathy.  He has a history of  30 years of low back pain with intermittent pain in the low back and across  the hips bilaterally.  Recently he has had difficulty with pain radiating  down into the left lower extremity into the left buttock, posterior thigh  and leg.  Walking tends to increase his pain and it is associated with  muscular spasm.  He has not had any previous lumbar surgery. The patient has  been treated with three series of epidural  steroid injections by [facility].  They have typically some relief for a few weeks but no lasting  relief.  He has also undergone chiropractic treatment in the past.  The  patient describes a sense of weakness in his left lower extremity but no  numbness or paresthesias.   PAST MEDICAL HISTORY:  Past medical history is notable for a history of  hypertension with no history of myocardial infarction, cancer, stroke,  diabetes or lung disease.   PAST SURGICAL HISTORY:  Previous surgery includes prostate surgery,  nephrectomy, cataract surgery and left knee surgery.   ALLERGIES:  He reports allergy to INTRATHECAL CONTRAST that caused a rash.   CURRENT MEDICATIONS:  Primary medications include chlorthalidone 25 mg q.d.,  Cardura 8 mg q.d., aspirin 81 mg q.d.,  Prevacid 31 mg q.d., Naproxen 500 mg  q.d., calcium 600 plus vitamin D b.i.d. and multivitamin q.d.   FAMILY HISTORY:  His parents have passed on. There is a family  history of  arthritis, cancer and heart disease.   SOCIAL HISTORY:  The patient is married, he is retired from the railroad.  He smokes a pack a day.  He has been smoking for forty years.  He does not  drink alcoholic beverages.  He denies history of substance abuse.   REVIEW OF SYSTEMS:  Notable for that which is described in the history of  present illness past medical history, but, is otherwise unremarkable.   PHYSICAL EXAMINATION:  GENERAL:  The patient is a well-developed, well-  nourished white male in no acute distress.  VITAL SIGNS:  Temperature 97.1, pulse 76, blood pressure 125/61, respiratory  rate 16, height 6 feet one inch, weight 231 pounds.  LUNGS:  Clear to auscultation.  He has symmetrical respiratory excursions.  HEART:  Regular rate and rhythm, no S1 and S2.  He has no murmur.  ABDOMEN:  Soft, nondistended.  Bowel sounds are present.  EXTREMITIES:  No cyanosis, clubbing or edema.  MUSCULOSKELETAL:  There is no tenderness to palpation over the lumbar  spinous processes or parallel vasculatures. He can extend fairly well to  straight leg raising and is negative bilaterally.  NEUROLOGIC:  Examination shows 5/5 strength in the distal lower extremities  including dorsiflexor, plantar flexor, extensor hallucis longus.  Sensation  is intact to pinprick to distal lower extremities.  Reflexes are 2  quadriceps, 1 at gastrocnemius and are symmetrical bilaterally, and toes are  downgoing bilaterally.  He has a normal gait and stance.   DIAGNOSTIC STUDIES:  MRI of the lumbar spine done at [facility] revealed a grade 1 to 2 spondylolisthesis at L5 and S1, a large  synovial cyst on the dorsum of the thecal sac compressing the thecal sac,  worse on the left than the right.  He had severe foraminal stenosis on the  left at L5-S1 and moderate stenosis in the foramen on the right at L5-S1.  There is marked pseudoarthrosis related to his L5 pars interarticularis   defect.   IMPRESSION:  Low back and left lumbar radicular pain secondary to an L5-S1  spondylolisthesis with resulting bilateral foraminal encroachment.  There is  large dorsal synovial cyst.  The spondylolisthesis is due to bilateral L5  pars interarticularis defect.   PLAN:  Patient to be admitted for an L5 Gill procedure with bilateral L5-S1  diskectomy, posterior lumbar interbody fusion with bone graft and bilateral  posterolateral L5-S1 posterolateral arthrodesis with posterior  instrumentation and bone graft.  I spoke with the patient at length in the  office today regarding his condition, options for treatment, the nature of  the surgical procedure using a model when he was seen in our office, we  discussed the risks and benefits of the surgery, possibility of prolonged  hospital stay for recuperation, need for postoperative mobile positioning  with lumbar corset and the risks including the risks of infection, bleeding,  possible need for transfusion, risk of nerve dysfunction with pain,  weakness, numbness and paresthesias, risk of dural tear, CSF leaks, and the  possible need for further surgery.  We discussed the risks of failure  regarding anesthesia risks, particularly in light of his heavy smoking  history and anesthetic risks of myocardial infarction, stoke, pneumonia and  death, all of which were increased due to his heavy smoking history.  Understanding all of this, he does wish to go ahead with surgery and will be  admitted for such.                                               Hewitt Shorts, M.D.    RWN/MEDQ  D:  07/23/2002  T:  07/23/2002  Job:  [id number]

## 2011-02-02 NOTE — H&P (Signed)
NAME:  Jose Gordon, Jose Gordon NO.:  [id number]   MEDICAL RECORD NO.:  [id number]          PATIENT TYPE:  INP   LOCATION:  5741                         FACILITY:  [facility]   PHYSICIAN:  Barbette Hair. Arlyce Dice, M.D. [facility] OF BIRTH:  1929/07/21   DATE OF ADMISSION:  03/30/2005  DATE OF DISCHARGE:                                HISTORY & PHYSICAL   CHIEF COMPLAINT:  Upper abdominal and lower chest and back pain.   HISTORY OF PRESENT ILLNESS:  Jose Gordon is a pleasant 77 year old white  male. Dr. Cyndie Chime follows for a history of lung cancer and the patient  did have a follow-up CT scan of the chest on the 22nd of June which showed  no evidence for recurrence of the disease or metastasis. He does have a  problem with esophageal stricture which have required dilations over the  years. Most recently he had a dilation in June 2006, and as part of a serial  dilation process on the July 11th by Dr. Arlyce Dice. This procedure was  uneventful. However, when the patient went home that afternoon he did  develop some chest pain and chills and the pain was also radiating into his  back kind of in a band-like pattern around his upper abdomen across his  back. He says it is worse when he takes breath in and it is worse when he  leans forward. He called Dr. Marzetta Board office and was set up for a CT of the  chest today. This chest CT is now confirming pneumomediastinum. The patient  was contacted at home in order to have him come to the hospital for a direct  admission. He is now seen in his hospital room and does not appear toxic.  However he has been taking all his meals since his endoscopy. He denies or  nausea. Other than the chills after the procedure he denies fever, sweats,  rigors.   ALLERGIES:  IV DYE.   MEDICATIONS:  1.  Chlorthalidone 25 mg daily.  2.  Gabapentin 300 mg daily.  3.  Naprosyn 500 mg daily.  4.  Doxazosin 8 mg daily.  5.  Alprazolam 0.5 mg daily.  6.  Albuterol  inhaler 90 mcg two puffs q.6h. p.r.n.  7.  Aspirin 81 mg daily.  8.  Calcium with vitamin D 600 mg two p.o. daily.  9.  Omeprazole 20 mg p.o. daily.  10. Spiriva inhaler one puff daily.  11. Multivitamins once daily.   PAST MEDICAL HISTORY:  1.  History of lung cancer, poorly differentiated squamous cell, stage I-B,      T2, N0, MX staging, resected from the right upper lobe by Dr. Edwyna Shell.      Surgery in January 2005.  2.  Hypertension.  3.  Benign prostatic hypertrophy for which he has undergone TURP.  4.  Gastroesophageal reflux disease with history of recurrent esophageal      strictures requiring dilations.  5.  Degenerative joint disease.  He is status post left knee surgery.  6.  History of skin cancers removed from the trunk and face.  7.  Question asthma or  COPD.  8.  Status post vasectomy.  9.  Status post cataract surgery times three.  10. Status post multiple lumbar spine surgeries.   SOCIAL HISTORY:  The patient lives with his wife. He does not smoke or  drink.   FAMILY HISTORY:  Both his parents died of old age in their mid 97s and late  25s. There is a history of lung cancer in his siblings. Of 10 siblings, only  two remain, the patient and his brother who suffers from end-stage renal  disease.   REVIEW OF SYSTEMS:  Since Tuesday he has not had any further fever, chills,  or sweats. He denies nausea or vomiting. Denies peripheral edema. He is  having increased belching. No odynophagia. He has nocturia just once  nightly. He has been taking the aspirin and Naprosyn both before and  following the endoscopy as is prescribed for him. Denies change in bowel  habits. Does admit to difficulty hearing in both ears. The patient's wife  says that just a few weeks ago the patient was complaining of abdominal pain  and that Dr. Wylene Simmer got some sort of imaging study, sounds like a CT scan,  at what sounds like [facility] and that it showed some fluid around the  pancreas  and this is from the wife. For treatment the patient was given 10  days of a twice daily antibiotic. The patient's symptoms have improved since  then and no other details are available and there are no imaging studies in  the [facility] system to suggest a workup of this problem, so it must  have been done at an outside radiology facility.   PHYSICAL EXAMINATION:  GENERAL: The patient is a pleasant and comfortable.  Does not look toxic. He is an elderly white male.  VITAL SIGNS: Pulse is 65, blood pressure 132/67, temperature 97.7, weight  240 pounds, and the patient is 6 feet 1 inch tall.  HEENT: Extraocular movements are intact. There is no conjunctival pallor.  Oropharynx reveals the mucosa is moist and clear. He has missing teeth, but  the remaining teeth are in good repair.  NECK/CHEST: The neck and upper chest are notable for no crepitus or masses.  There are decreased breath sounds actually in the left side, but not the  right side where he had a  surgery.  COR: Regular rate and rhythm with no murmurs, rubs, or gallops appreciated.  ABDOMEN: Soft, nontender, and nondistended. Bowel sounds are active. There  is no hepatosplenomegaly or masses. He does have some costovertebral angle  tenderness bilaterally with percussion.  RECTAL/GU EXAM: Deferred.  EXTREMITIES: No clubbing, cyanosis, or edema. Dorsalis pedis pulses are 2 to  3+ bilaterally.  PSYCHIATRIC: The patient's affect is not depressed.  NEUROLOGIC: No tremor appreciated. Grip is 5/5. The patient is able to stand  and walk without assistance. He is somewhat hard of hearing.   LABORATORY DATA:  Pending at this time are a CMET, CBC with differential,  lipase and coags.   ASSESSMENT:  Esophageal perforation following dilatation of the esophagus  evidenced by pneumomediastinum on CT scan done this morning. We have called  [facility] for a surgical evaluation and Dr. Edwyna Shell will be seeing the patient today. At present the  patient does not look toxic.   PLAN:  1.  The patient is to be kept NPO and supported IV fluids. Will begin IV      Unasyn. Question for Dr. Edwyna Shell will be whether or not the patient  needs      a Gastrografin swallow in order to determine if the perforation has      healed      itself over or whether it is still active, but will defer this study to      his expertise.  2.  Of course we are holding all his oral medications. Some of these are      somewhat optional and for now where possible will substitute with IV      medications.       SG/MEDQ  D:  03/30/2005  T:  03/30/2005  Job:  [id number]

## 2011-02-02 NOTE — Op Note (Signed)
NAME:  Jose Gordon, Jose Gordon                      ACCOUNT NO.:  [id number]   MEDICAL RECORD NO.:  [id number]                   PATIENT TYPE:  OIB   LOCATION:  2899                                 FACILITY:  [facility]   PHYSICIAN:  Ines Bloomer, M.D.              DATE OF BIRTH:  1929/04/03   DATE OF PROCEDURE:  10/06/2003  DATE OF DISCHARGE:                                 OPERATIVE REPORT   PREOPERATIVE DIAGNOSIS:  Right upper lobe mass with mediastinal adenopathy.   POSTOPERATIVE DIAGNOSIS:  Right upper lobe mass with mediastinal adenopathy.   OPERATION PERFORMED:  Fiberoptic bronchoscopy and mediastinoscopy.   SURGEON:  Ines Bloomer, M.D.   ANESTHESIA:  General.   DESCRIPTION OF PROCEDURE:  After general anesthesia the fiberoptic  bronchoscope was passed through the endotracheal tube.  The right upper  lobe, right middle lobe and right lower lobe orifices were normal.  The left  upper lobe and left lower lobe orifices were normal.  Brushings were taken  from the posterior segment of the right upper lobe as the video bronchoscope  was removed.  Washings were also taken.  The anterior neck was prepped and  draped in the usual sterile manner.  A transverse incision was made and  carried down with electrocautery through the subcutaneous tissue and fascia.  The pretracheal fascia was entered.  A digital exploration was carried out  and then the mediastinoscope was inserted and biopsies of two 4R, one 4L and  one 2R nodes were done.  Strap muscles were closed with 2-0 Vicryl.  Subcutaneous tissue with 3-0 Vicryl and Dermabond for the skin.  The patient  was returned to the recovery room in stable condition.                                               Ines Bloomer, M.D.    DPB/MEDQ  D:  10/06/2003  T:  10/06/2003  Job:  [id number]   cc:   Charlaine Dalton. Sherene Sires, M.D. [facility]

## 2011-02-02 NOTE — Discharge Summary (Signed)
NAME:  KYROS, SALZWEDEL NO.:  [id number]   MEDICAL RECORD NO.:  [id number]          PATIENT TYPE:  INP   LOCATION:  5741                         FACILITY:  [facility]   PHYSICIAN:  Iva Boop, M.D. LHCDATE OF BIRTH:  1929-06-15   DATE OF ADMISSION:  03/30/2005  DATE OF DISCHARGE:  04/05/2005                                 DISCHARGE SUMMARY   ADMISSION DIAGNOSES:  1.  Esophageal perforation following upper endoscopy with esophageal      dilatation.  2.  History of recurrent esophageal strictures, dilations in June 2006 and      March 27, 2005.  3.  History of poorly-differentiated lung cancer, status post right upper      lobe resection, staging IB T2 N0 MX.  Surgery by Dr. Edwyna Shell in January      2005.  4.  Hypertension.  5.  Benign prostatic hypertrophy, status post transurethral resection of the      prostate.  6.  Degenerative joint disease, status post left knee surgery.  7.  History of skin cancers, these have been removed from the trunk and      face.  8.  Questionable chronic obstructive pulmonary disease and asthma.  9.  Status post vasectomy.  10. Status post cataract surgery x3.  11. Status post multiple lumbar spine surgeries.   DISCHARGE DIAGNOSES:  1.  Walled off esophageal perforation treated conservatively with      antibiotics and a period of bowel rest, stable and afebrile throughout      this hospitalization.  2.  History of lung cancer.  3.  Hyperglycemia.   CONSULTATIONS:  Dr. Karle Plumber.   PROCEDURE:  None.   BRIEF HISTORY:  Mr. Jose Gordon is a pleasant 77 year old white male.  Dr.  Edwyna Shell knows him and Dr. Cyndie Chime follows him for a history of lung  cancer.  He has had problems with esophageal strictures which required  dilatation in June 2006, and again in March 27, 2005, as part of a series of  serial dilatation.  The evening after his upper endoscopy/dilatation on March 27, 2005 he developed chills, chest pain and back pain.   He called Dr.  Arlyce Dice on March 29, 2005 and was set up for a CT scan.  This CT scan showed  pneumomediastinum consistent with an esophageal perforation.  He was advised  to come to the hospital for admission for treatment of the perforation.  The  patient had not had any nausea or vomiting.  He had been eating solid and  liquids since he went home following the procedure.  He had had some chills  and did not take his temperature at home.  He described the pain as worse  with deep inspiration and with bending forward.   On exam when he initially came to the hospital the patient looked remarkably  nontoxic.  He was comfortable, he was afebrile and vital signs were stable.   LABS:  Initial white blood cell count 11.3, 6.6 at discharge.  Hemoglobin  13.6, hematocrit 39.  MCV 83.4, platelets 315,000.  Differential did reveal  a left  shift.  PT 14.1, INR 1.1, and PTT 34.  Sodium 135, potassium 3.4,  glucose 136, BUN 12, creatinine 1.1, albumin 3.4, total bilirubin 1.2,  alkaline phosphatase 89, AST 21, ALT 19, lipase 24, C. diff toxin negative.  Imaging studies to his chest showed opacity at the left lung base, possibly  atelectasis, but unable to exclude opacity secondary to extravasation of  contrast from distal esophagus.  Gastrografin esophagram showed  extravasation of the contrast from the distal esophagus several centimeters  above the GE junction consistent with a contained esophageal perforation.  Follow-up two-view chest showed little change in the slight increase of the  opacity in the left lower lobe.  CT scan of the chest showed interval  improvement of pneumomediastinum, no evidence for abscess.  This was in  comparison to CT scan of March 30, 2005.   HOSPITAL COURSE:  1.  Esophageal perforation.  A CT scan performed just before the patient      came to the hospital did show the esophageal perforation.  This was      determined to be a contained esophageal perforation and in deed  on      barium esophagram and then with followup CT it showed stable appearance      of a contained esophageal perforation.  The patient was afebrile.  He      was started on IV Unasyn.  Initially he was kept n.p.o., but slowly his      diet was progressed to a soft-textured diet, all of which he tolerated      well.  He was afebrile, he had no signs of hemodynamic instability.      Chest discomfort was mild to moderate and he did not require a lot of      analgesics.  Dr. Edwyna Shell evaluated him and agreed with conservative      therapy.  On hospital day seven he was discharged in stable condition.  2.  Hyperglycemia.  The patient does not carry a diagnosis of diabetes      mellitus.  He did have an isolated serum blood glucose of 136 and this      should be followed up as an outpatient to see whether or not it persists      and whether he needs to begin treatment for new-onset diabetes mellitus.  3.  Lung cancer status post resection.  This was not an active problem      during this admission.  Note that on the CT scan there was a small left      pleural effusion along with some mediastinal and paratracheal      lymphadenopathy.   MEDICATIONS AT DISCHARGE:  1.  Chlortalidone 25 mg daily.  2.  Gabapentin 300 mg daily.  3.  Naproxen 500 mg daily -- he was not to continue the naproxen for the      time being until the doctors okayed restart of this medication.  4.  Doxazosin 8 mg daily.  5.  Alprazolam 0.5 mg daily.  6.  Albuterol metered dose inhaler 90 mcg, 2 puffs q.6h. p.r.n.  7.  Aspirin 81 mg daily.  8.  Calcium with vitamin D 600 mg two p.o. daily.  9.  Omeprazole 20 mg p.o. daily.  10. Spiriva inhaler one puff daily.  11. Multivitamins once daily.  12. Augmentin 875 mg p.o. b.i.d. for five more days.   FOLLOWUP:  With Dr. Arlyce Dice on April 13, 2005 at 11:30 a.m.  The  patient was  advised to call Dr. Marzetta Board office for any chest pain, nausea, vomiting or  fevers.      Jennye Moccasin, P.A. [facility]      Iva Boop, M.D. [facility]  Electronically Signed    SG/MEDQ  D:  06/01/2005  T:  06/01/2005  Job:  [id number]   cc:   Ines Bloomer, M.D.  Fax: [phone]   Gaspar Garbe, M.D.  Fax: [phone]   Barbette Hair. Arlyce Dice, M.D. [facility]  [address]  [place]  [place] [address]

## 2011-03-27 ENCOUNTER — Ambulatory Visit (HOSPITAL_COMMUNITY)
Admission: RE | Admit: 2011-03-27 | Discharge: 2011-03-27 | Disposition: A | Discharge status: Home / Self Care | Payer: MEDICARE | Source: Ambulatory Visit | Attending: Oncology | Admitting: Oncology

## 2011-03-27 ENCOUNTER — Other Ambulatory Visit: Payer: Self-pay | Admitting: Oncology

## 2011-03-27 ENCOUNTER — Encounter (HOSPITAL_BASED_OUTPATIENT_CLINIC_OR_DEPARTMENT_OTHER): Payer: MEDICARE | Admitting: Oncology

## 2011-03-27 DIAGNOSIS — C3410 Malignant neoplasm of upper lobe, unspecified bronchus or lung: Secondary | ICD-10-CM

## 2011-03-27 DIAGNOSIS — Z902 Acquired absence of lung [part of]: Secondary | ICD-10-CM | POA: Insufficient documentation

## 2011-03-27 DIAGNOSIS — Z85118 Personal history of other malignant neoplasm of bronchus and lung: Secondary | ICD-10-CM

## 2011-03-27 DIAGNOSIS — R0602 Shortness of breath: Secondary | ICD-10-CM | POA: Insufficient documentation

## 2011-03-27 LAB — CBC WITH DIFFERENTIAL/PLATELET
Basophils Absolute: 0 10*3/uL (ref 0.0–0.1)
EOS%: 2.1 % (ref 0.0–7.0)
HGB: 16.9 g/dL (ref 13.0–17.1)
MCH: 30.5 pg (ref 27.2–33.4)
MCV: 87.8 fL (ref 79.3–98.0)
MONO%: 10 % (ref 0.0–14.0)
RBC: 5.55 10*6/uL (ref 4.20–5.82)
RDW: 12.7 % (ref 11.0–14.6)

## 2011-03-27 LAB — COMPREHENSIVE METABOLIC PANEL
AST: 22 U/L (ref 0–37)
Albumin: 3.7 g/dL (ref 3.5–5.2)
Alkaline Phosphatase: 58 U/L (ref 39–117)
Potassium: 4.6 mEq/L (ref 3.5–5.3)
Sodium: 136 mEq/L (ref 135–145)
Total Protein: 7 g/dL (ref 6.0–8.3)

## 2011-03-27 LAB — LACTATE DEHYDROGENASE: LDH: 159 U/L (ref 94–250)

## 2011-03-27 MED ORDER — IOHEXOL 300 MG/ML  SOLN
80.0000 mL | Freq: Once | INTRAMUSCULAR | Status: AC | PRN
  Administered 2011-03-27: 80 mL via INTRAVENOUS

## 2011-04-03 ENCOUNTER — Encounter (HOSPITAL_BASED_OUTPATIENT_CLINIC_OR_DEPARTMENT_OTHER): Payer: MEDICARE | Admitting: Oncology

## 2011-04-03 DIAGNOSIS — C3410 Malignant neoplasm of upper lobe, unspecified bronchus or lung: Secondary | ICD-10-CM

## 2011-04-18 ENCOUNTER — Ambulatory Visit (INDEPENDENT_AMBULATORY_CARE_PROVIDER_SITE_OTHER): Payer: MEDICARE | Admitting: Thoracic Surgery

## 2011-04-18 DIAGNOSIS — C3490 Malignant neoplasm of unspecified part of unspecified bronchus or lung: Secondary | ICD-10-CM

## 2011-04-18 NOTE — Assessment & Plan Note (Signed)
OFFICE VISIT  Jose Gordon, Jose Gordon DOB:  Aug 25, 1929                                        April 18, 2011 CHART #:  [id number]  The patient came for followup.  Today, he is now 3 years since we did his last resection and CT scan showed no evidence of recurrence.  He will see Dr. Cyndie Chime next year who continued to follow him.  His blood pressure is 139/76, pulse 78, respirations 20 and sats were 95%. I appreciate the opportunity of seeing the patient.  Ines Bloomer, M.D. Electronically Signed  DPB/MEDQ  D:  04/18/2011  T:  04/18/2011  Job:  [id number]

## 2011-06-14 LAB — CBC
HCT: 36.8 — ABNORMAL LOW
HCT: 38.2 — ABNORMAL LOW
HCT: 30.4 — ABNORMAL LOW
HCT: 32.3 — ABNORMAL LOW
HCT: 34.5 — ABNORMAL LOW
HCT: 34.5 — ABNORMAL LOW
HCT: 33.7 — ABNORMAL LOW
Hemoglobin: 13.1
Hemoglobin: 13.1
Hemoglobin: 11.2 — ABNORMAL LOW
Hemoglobin: 11.6 — ABNORMAL LOW
Hemoglobin: 11.7 — ABNORMAL LOW
Hemoglobin: 12.2 — ABNORMAL LOW
Hemoglobin: 11.8 — ABNORMAL LOW
MCHC: 35
MCHC: 35.1
MCHC: 35.5
MCHC: 35.6
MCHC: 34
MCHC: 34.5
MCHC: 34.7
MCHC: 34.7
MCHC: 35.4
MCV: 90.3
MCV: 90.4
MCV: 90.7
MCV: 88.9
MCV: 89.5
MCV: 90.8
Platelets: 158
Platelets: 172
Platelets: 181
Platelets: 398
Platelets: 410 — ABNORMAL HIGH
Platelets: 435 — ABNORMAL HIGH
RBC: 4.11 — ABNORMAL LOW
RBC: 3.19 — ABNORMAL LOW
RBC: 3.64 — ABNORMAL LOW
RBC: 3.64 — ABNORMAL LOW
RBC: 3.75 — ABNORMAL LOW
RBC: 3.8 — ABNORMAL LOW
RBC: 3.82 — ABNORMAL LOW
RDW: 14.3
RDW: 14.4
RDW: 14.5
RDW: 14.7
RDW: 14.9
RDW: 14.6
WBC: 5.3
WBC: 7.2
WBC: 10.4
WBC: 10.9 — ABNORMAL HIGH
WBC: 11.5 — ABNORMAL HIGH
WBC: 14.2 — ABNORMAL HIGH
WBC: 9.4
WBC: 9.7
WBC: 24.2 — ABNORMAL HIGH

## 2011-06-14 LAB — BLOOD GAS, ARTERIAL
FIO2: 0.21
Patient temperature: 98.6
pH, Arterial: 7.437

## 2011-06-14 LAB — EXPECTORATED SPUTUM ASSESSMENT W GRAM STAIN, RFLX TO RESP C

## 2011-06-14 LAB — BASIC METABOLIC PANEL
BUN: 5 — ABNORMAL LOW
BUN: 11
BUN: 4 — ABNORMAL LOW
BUN: 4 — ABNORMAL LOW
BUN: 7
BUN: 8
CO2: 28
CO2: 32
CO2: 23
CO2: 29
CO2: 27
Calcium: 8.1 — ABNORMAL LOW
Calcium: 8.4
Calcium: 8.6
Chloride: 104
Chloride: 94 — ABNORMAL LOW
Chloride: 93 — ABNORMAL LOW
Chloride: 97
Chloride: 98
Chloride: 97
Creatinine, Ser: 0.73
Creatinine, Ser: 0.77
Creatinine, Ser: 0.86
GFR calc Af Amer: >60
GFR calc Af Amer: >60
GFR calc Af Amer: >60
GFR calc Af Amer: >60
GFR calc Af Amer: >60
GFR calc non Af Amer: >60
GFR calc non Af Amer: >60
GFR calc non Af Amer: >60
GFR calc non Af Amer: >60
Glucose, Bld: 139 — ABNORMAL HIGH
Glucose, Bld: 90
Glucose, Bld: 96
Glucose, Bld: 98
Potassium: 4.4
Potassium: 3.6
Potassium: 3.8
Potassium: 3.9
Potassium: 4.5
Sodium: 133 — ABNORMAL LOW
Sodium: 124 — ABNORMAL LOW
Sodium: 127 — ABNORMAL LOW
Sodium: 129 — ABNORMAL LOW
Sodium: 132 — ABNORMAL LOW
Sodium: 134 — ABNORMAL LOW

## 2011-06-14 LAB — COMPREHENSIVE METABOLIC PANEL
ALT: 27
ALT: 38
ALT: 38
AST: 21
AST: 31
AST: 21
AST: 39 — ABNORMAL HIGH
Albumin: 2.8 — ABNORMAL LOW
Albumin: 3.6
Albumin: 1.9 — ABNORMAL LOW
Alkaline Phosphatase: 55
Alkaline Phosphatase: 191 — ABNORMAL HIGH
Alkaline Phosphatase: 192 — ABNORMAL HIGH
BUN: 4 — ABNORMAL LOW
BUN: 2 — ABNORMAL LOW
BUN: 3 — ABNORMAL LOW
BUN: 8
CO2: 30
CO2: 25
CO2: 30
Calcium: 8.7
Calcium: 8.9
Calcium: 8.4
Chloride: 102
Chloride: 93 — ABNORMAL LOW
Chloride: 97
Chloride: 98
Chloride: 89 — ABNORMAL LOW
Creatinine, Ser: 0.67
Creatinine, Ser: 0.82
GFR calc Af Amer: >60
GFR calc Af Amer: >60
GFR calc Af Amer: >60
GFR calc Af Amer: >60
GFR calc Af Amer: >60
GFR calc non Af Amer: >60
GFR calc non Af Amer: >60
GFR calc non Af Amer: >60
Glucose, Bld: 128 — ABNORMAL HIGH
Glucose, Bld: 103 — ABNORMAL HIGH
Glucose, Bld: 129 — ABNORMAL HIGH
Potassium: 3.8
Potassium: 3.8
Potassium: 3.9
Potassium: 4.8
Sodium: 131 — ABNORMAL LOW
Sodium: 125 — ABNORMAL LOW
Sodium: 122 — ABNORMAL LOW
Total Bilirubin: 0.8
Total Bilirubin: 1.2
Total Bilirubin: 1.3 — ABNORMAL HIGH
Total Bilirubin: 1.9 — ABNORMAL HIGH
Total Bilirubin: 3.1 — ABNORMAL HIGH
Total Protein: 5.3 — ABNORMAL LOW
Total Protein: 5.6 — ABNORMAL LOW

## 2011-06-14 LAB — URINALYSIS, ROUTINE W REFLEX MICROSCOPIC
Glucose, UA: NEGATIVE
Ketones, ur: 15 — AB
Ketones, ur: 15 — AB
Nitrite: NEGATIVE
Protein, ur: 100 — AB
Urobilinogen, UA: 1
pH: 6

## 2011-06-14 LAB — B-NATRIURETIC PEPTIDE (CONVERTED LAB): Pro B Natriuretic peptide (BNP): 176 — ABNORMAL HIGH

## 2011-06-14 LAB — PROTIME-INR
INR: 1
Prothrombin Time: 16.1 — ABNORMAL HIGH

## 2011-06-14 LAB — URINE CULTURE
Colony Count: NO GROWTH
Special Requests: NEGATIVE

## 2011-06-14 LAB — POCT I-STAT 3, ART BLOOD GAS (G3+)
Acid-Base Excess: 2
O2 Saturation: 96
Operator id: 277551
pCO2 arterial: 40.1
pH, Arterial: 7.427
pO2, Arterial: 83
pO2, Arterial: 177 — ABNORMAL HIGH

## 2011-06-14 LAB — URINE MICROSCOPIC-ADD ON

## 2011-06-14 LAB — TSH: TSH: 1.432

## 2011-06-14 LAB — CARDIAC PANEL(CRET KIN+CKTOT+MB+TROPI)
CK, MB: 2.7
Relative Index: INVALID
Total CK: 19
Total CK: 27
Troponin I: 0.1 — ABNORMAL HIGH

## 2011-06-14 LAB — DIFFERENTIAL
Basophils Absolute: 0
Basophils Relative: 0
Eosinophils Absolute: 0
Lymphocytes Relative: 2 — ABNORMAL LOW
Monocytes Absolute: 1.5 — ABNORMAL HIGH
Neutro Abs: 22.2 — ABNORMAL HIGH
Neutrophils Relative %: 92 — ABNORMAL HIGH

## 2011-06-14 LAB — MAGNESIUM
Magnesium: 2
Magnesium: 2.1

## 2011-06-14 LAB — BODY FLUID CULTURE

## 2011-06-14 LAB — CULTURE, ROUTINE-ABSCESS

## 2011-06-14 LAB — TYPE AND SCREEN

## 2011-06-14 LAB — VANCOMYCIN, TROUGH: Vancomycin Tr: 19.2

## 2011-06-14 LAB — CULTURE, BLOOD (ROUTINE X 2)

## 2011-06-14 LAB — CULTURE, RESPIRATORY W GRAM STAIN: Gram Stain: NONE SEEN

## 2011-06-14 LAB — POCT CARDIAC MARKERS: Operator id: 231701

## 2011-06-14 LAB — OSMOLALITY: Osmolality: 252 — ABNORMAL LOW

## 2011-06-14 LAB — APTT
aPTT: 28
aPTT: 27

## 2011-09-12 ENCOUNTER — Other Ambulatory Visit: Payer: Self-pay | Admitting: Oncology

## 2011-09-12 DIAGNOSIS — C3490 Malignant neoplasm of unspecified part of unspecified bronchus or lung: Secondary | ICD-10-CM

## 2011-10-09 ENCOUNTER — Telehealth: Payer: Self-pay | Admitting: Oncology

## 2011-10-09 NOTE — Telephone Encounter (Signed)
Talked to pt's wife, gave her appt for July 2013, lab, CT and MD visit , npo 4 hours prior to scan

## 2011-10-20 ENCOUNTER — Telehealth: Payer: Self-pay | Admitting: *Deleted

## 2011-10-20 NOTE — Telephone Encounter (Signed)
left messge to inform the patient of the new date and time on 03-25-2012 labs and 04-01-2012 [place] at 11:00am

## 2012-03-25 ENCOUNTER — Other Ambulatory Visit (HOSPITAL_BASED_OUTPATIENT_CLINIC_OR_DEPARTMENT_OTHER): Payer: MEDICARE | Admitting: Lab

## 2012-03-25 ENCOUNTER — Ambulatory Visit (HOSPITAL_COMMUNITY)
Admission: RE | Admit: 2012-03-25 | Discharge: 2012-03-25 | Disposition: A | Discharge status: Home / Self Care | Payer: MEDICARE | Source: Ambulatory Visit | Attending: Oncology | Admitting: Oncology

## 2012-03-25 ENCOUNTER — Telehealth: Payer: Self-pay | Admitting: *Deleted

## 2012-03-25 DIAGNOSIS — C3490 Malignant neoplasm of unspecified part of unspecified bronchus or lung: Secondary | ICD-10-CM | POA: Insufficient documentation

## 2012-03-25 DIAGNOSIS — C3410 Malignant neoplasm of upper lobe, unspecified bronchus or lung: Secondary | ICD-10-CM

## 2012-03-25 DIAGNOSIS — R0602 Shortness of breath: Secondary | ICD-10-CM | POA: Insufficient documentation

## 2012-03-25 LAB — CBC WITH DIFFERENTIAL/PLATELET
Basophils Absolute: 0 10*3/uL (ref 0.0–0.1)
Eosinophils Absolute: 0.2 10*3/uL (ref 0.0–0.5)
HGB: 16.7 g/dL (ref 13.0–17.1)
LYMPH%: 27 % (ref 14.0–49.0)
MCH: 30.7 pg (ref 27.2–33.4)
MCV: 86.6 fL (ref 79.3–98.0)
MONO%: 10.4 % (ref 0.0–14.0)
NEUT#: 3.9 10*3/uL (ref 1.5–6.5)
NEUT%: 58.8 % (ref 39.0–75.0)
Platelets: 204 10*3/uL (ref 140–400)

## 2012-03-25 LAB — CMP (CANCER CENTER ONLY)
Albumin: 3.8 g/dL (ref 3.3–5.5)
Alkaline Phosphatase: 51 U/L (ref 26–84)
BUN, Bld: 10 mg/dL (ref 7–22)
Creat: 1 mg/dl (ref 0.6–1.2)
Glucose, Bld: 118 mg/dL (ref 73–118)
Potassium: 5.4 mEq/L — ABNORMAL HIGH (ref 3.3–4.7)
Total Bilirubin: 1.2 mg/dl (ref 0.20–1.60)

## 2012-03-25 MED ORDER — IOHEXOL 300 MG/ML  SOLN: 100.0000 mL | Freq: Once | INTRAMUSCULAR | Status: AC | PRN

## 2012-03-25 NOTE — Telephone Encounter (Signed)
Pt's wife returned call, he is not taking a potassium supplement. Lab result given. Instructed her to have pt follow up with Dr. Wylene Simmer. She voiced understanding.

## 2012-03-25 NOTE — Telephone Encounter (Signed)
CMET reviewed by Dr. Cyndie Chime: If pt is taking KCL, have him stop. Refer to primary care MD for follow up. Left message on voicemail for pt to call office. Lab sent to Dr. Wylene Simmer.

## 2012-04-01 ENCOUNTER — Ambulatory Visit (HOSPITAL_BASED_OUTPATIENT_CLINIC_OR_DEPARTMENT_OTHER): Payer: MEDICARE | Admitting: Oncology

## 2012-04-01 ENCOUNTER — Encounter: Payer: Self-pay | Admitting: Oncology

## 2012-04-01 ENCOUNTER — Telehealth: Payer: Self-pay | Admitting: Oncology

## 2012-04-01 VITALS — BP 153/72 | HR 63 | Temp 96.9°F | Wt 245.8 lb

## 2012-04-01 DIAGNOSIS — C3430 Malignant neoplasm of lower lobe, unspecified bronchus or lung: Secondary | ICD-10-CM

## 2012-04-01 DIAGNOSIS — C3410 Malignant neoplasm of upper lobe, unspecified bronchus or lung: Secondary | ICD-10-CM

## 2012-04-01 DIAGNOSIS — J449 Chronic obstructive pulmonary disease, unspecified: Secondary | ICD-10-CM | POA: Insufficient documentation

## 2012-04-01 DIAGNOSIS — I10 Essential (primary) hypertension: Secondary | ICD-10-CM

## 2012-04-01 DIAGNOSIS — E119 Type 2 diabetes mellitus without complications: Secondary | ICD-10-CM

## 2012-04-01 HISTORY — DX: Type 2 diabetes mellitus without complications: E11.9

## 2012-04-01 HISTORY — DX: Malignant neoplasm of lower lobe, unspecified bronchus or lung: C34.30

## 2012-04-01 HISTORY — DX: Chronic obstructive pulmonary disease, unspecified: J44.9

## 2012-04-01 HISTORY — DX: Malignant neoplasm of upper lobe, unspecified bronchus or lung: C34.10

## 2012-04-01 HISTORY — DX: Essential (primary) hypertension: I10

## 2012-04-01 NOTE — Telephone Encounter (Signed)
Gave pt appt for July 2014 MD only, advise pt to ger chest x-ray before MD visit, pt will draw labs @ Primary MD

## 2012-04-01 NOTE — Progress Notes (Signed)
Hematology and Oncology Follow Up Visit  Jose Gordon [id number] 12-Jan-1929 78 y.o. 04/01/2012 11:52 AM   Principle Diagnosis: Encounter Diagnoses  Name Primary?  . Lung cancer, upper lobe Yes  . Lung cancer, lower lobe   . COPD (chronic obstructive pulmonary disease)   . HTN (hypertension), benign   . DM type 2 (diabetes mellitus, type 2)      Interim History: Followup visit for this pleasant 78 year old man with a history of Metachronous primary non-small cell lung cancers, initial stage IB T2 N0 poorly differentiated squamous cell carcinoma with focal pleural involvement, lymph node negative, treated with surgery alone.  Second primary 1.2-cm, T1a N0 M0 moderately differentiated squamous cell carcinoma, right lower lung, again treated with surgery alone. Second surgery was complicated by postoperative development of a lung abscess requiring prolonged hospitalization. He is doing well at this time. He has advanced obstructive airway disease and his exercise tolerance is negligible. He gets dyspneic with mild exertion just walking across the room. He is not using any oxygen therapy. He uses his bronchodilators on a routine basis. He has had no interim medical problems. He denies any headache or change in vision, no chronic cough, no change in bowel habit, no urinary tract symptoms.   Medications: reviewed  Allergies:  Allergies  Allergen Reactions  . Iohexol      Desc: remote allergy of rash 20 yrs ago. has had since with no pre-meds and no problems     Review of Systems: Constitutional:   No constitutional symptoms see above Respiratory: See above Cardiovascular: No chest pain or palpitations Gastrointestinal: See above Genito-Urinary: See above Musculoskeletal: Chronic degenerative arthritis with decreasing mobility particularly affecting his knees Neurologic: See above Skin: No rash Remaining ROS negative.  Physical Exam: Blood pressure 153/72, pulse 63,  temperature 96.9 F (36.1 C), temperature source Oral, weight 245 lb 12.8 oz (111.494 kg). Wt Readings from Last 3 Encounters:  04/01/12 245 lb 12.8 oz (111.494 kg)     General appearance: Well-nourished Caucasian man HENNT: Pharynx no erythema or exudate Lymph nodes: No adenopathy Breasts: Lungs: Slight decreased breath sounds left base but lungs overall clear and resonant throughout Heart: Regular rhythm no murmur Abdomen: Soft nontender ventral hernia no mass no organomegaly Extremities: No edema no calf tenderness Vascular: No cyanosis Neurologic: He is hard of hearing. Cranial nerves otherwise normal. Motor strength 5 over 5. Skin: No rash or ecchymosis  Lab Results: Lab Results  Component Value Date   WBC 6.6 03/25/2012   HGB 16.7 03/25/2012   HCT 47.2 03/25/2012   MCV 86.6 03/25/2012   PLT 204 03/25/2012     Chemistry      Component Value Date/Time   NA 136 03/25/2012 0929   NA 136 03/27/2011 0752   K 5.4* 03/25/2012 0929   K 4.6 03/27/2011 0752   CL 96* 03/25/2012 0929   CL 97 03/27/2011 0752   CO2 31 03/25/2012 0929   CO2 31 03/27/2011 0752   BUN 10 03/25/2012 0929   BUN 10 03/27/2011 0752   CREATININE 1.0 03/25/2012 0929   CREATININE 0.94 03/27/2011 0752      Component Value Date/Time   CALCIUM 9.0 03/25/2012 0929   CALCIUM 9.5 03/27/2011 0752   ALKPHOS 51 03/25/2012 0929   ALKPHOS 58 03/27/2011 0752   AST 21 03/25/2012 0929   AST 22 03/27/2011 0752   ALT 14 03/27/2011 0752   BILITOT 1.20 03/25/2012 0929   BILITOT 0.7 03/27/2011 0752  Radiological Studies: Ct Chest W Contrast I personally reviewed CT images with the patient and his wife. There are postsurgical changes but no evidence for recurrent lung cancer.  03/25/2012  *RADIOLOGY REPORT*  Clinical Data: Lung cancer status post resection.  Shortness of breath.  CT CHEST WITH CONTRAST  Technique:  Multidetector CT imaging of the chest was performed following the standard protocol during bolus administration of intravenous contrast.   Contrast:  100 ml Omnipaque-300 intravenously.  Comparison: Chest CT 03/27/2011 and 09/26/2010.  Findings: There are stable postsurgical changes status post right upper lobe resection.  No recurrent mass or endobronchial lesion is identified.  There is stable chronic scarring or atelectasis within the right middle and left lower lobes.  The mediastinum has a stable appearance with stable small lymph nodes.  These include an 8 mm precarinal node on image 27 and an 11 mm subcarinal node on image 33. There is no progressive adenopathy. There is no pleural or pericardial effusion.  Coronary artery calcifications and a small hiatal hernia are stable.  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  IMPRESSION:  1.  Stable postoperative examination.  No evidence of local recurrence or metastatic disease. 2.  Stable left lower lobe pulmonary sequestration.  Original Report Authenticated By: Gerrianne Scale, M.D.    Impression and Plan: #1. Metachronous primary right upper lobe and right lower lobe lung cancers treated with surgery alone. He remains in remission with no evidence for new disease now out 8 years from initial resection and 4 years from the second resection. Oh see him again in one year and just get a regular chest x-ray.  #2. Obstructive airway disease.  #3. Type 2 diabetes  #4. Essential hypertension.  #5. Hyperkalemia with normal renal function not on any potassium supplements. He is reviewed this with his primary care physician. He is recommending observation alone at this time.  CC:. Dr. Guerry Bruin   Levert Feinstein, MD 7/16/201311:52 AM

## 2013-03-13 ENCOUNTER — Other Ambulatory Visit (HOSPITAL_COMMUNITY): Payer: Self-pay | Admitting: Internal Medicine

## 2013-03-13 DIAGNOSIS — R918 Other nonspecific abnormal finding of lung field: Secondary | ICD-10-CM

## 2013-03-17 ENCOUNTER — Telehealth: Payer: Self-pay | Admitting: *Deleted

## 2013-03-17 NOTE — Telephone Encounter (Signed)
Jose Gordon with radiology called with a question from patient's family.  Dr. Wylene Simmer has ordered a CT chest for 03-18-2013.  Family asked if CXR is needed.  Verbal order received and read back from Dr. Cyndie Chime to cancel the CXR.  Jose Gordon notified that CXR is no longer needed if CT chest is done tomorrow.

## 2013-03-18 ENCOUNTER — Encounter (HOSPITAL_COMMUNITY): Payer: Self-pay

## 2013-03-18 ENCOUNTER — Ambulatory Visit (HOSPITAL_COMMUNITY)
Admission: RE | Admit: 2013-03-18 | Discharge: 2013-03-18 | Disposition: A | Discharge status: Home / Self Care | Payer: MEDICARE | Source: Ambulatory Visit | Attending: Internal Medicine | Admitting: Internal Medicine

## 2013-03-18 DIAGNOSIS — R599 Enlarged lymph nodes, unspecified: Secondary | ICD-10-CM | POA: Insufficient documentation

## 2013-03-18 DIAGNOSIS — I2510 Atherosclerotic heart disease of native coronary artery without angina pectoris: Secondary | ICD-10-CM | POA: Insufficient documentation

## 2013-03-18 DIAGNOSIS — R222 Localized swelling, mass and lump, trunk: Secondary | ICD-10-CM | POA: Insufficient documentation

## 2013-03-18 DIAGNOSIS — Z902 Acquired absence of lung [part of]: Secondary | ICD-10-CM | POA: Insufficient documentation

## 2013-03-18 DIAGNOSIS — Z85118 Personal history of other malignant neoplasm of bronchus and lung: Secondary | ICD-10-CM | POA: Insufficient documentation

## 2013-03-18 DIAGNOSIS — R918 Other nonspecific abnormal finding of lung field: Secondary | ICD-10-CM

## 2013-03-18 DIAGNOSIS — J189 Pneumonia, unspecified organism: Secondary | ICD-10-CM | POA: Insufficient documentation

## 2013-03-18 DIAGNOSIS — I700 Atherosclerosis of aorta: Secondary | ICD-10-CM | POA: Insufficient documentation

## 2013-03-18 MED ORDER — IOHEXOL 300 MG/ML  SOLN
80.0000 mL | Freq: Once | INTRAMUSCULAR | Status: AC | PRN
  Administered 2013-03-18: 80 mL via INTRAVENOUS

## 2013-03-27 ENCOUNTER — Telehealth: Payer: Self-pay | Admitting: *Deleted

## 2013-03-27 ENCOUNTER — Ambulatory Visit (HOSPITAL_BASED_OUTPATIENT_CLINIC_OR_DEPARTMENT_OTHER): Payer: MEDICARE | Admitting: Oncology

## 2013-03-27 ENCOUNTER — Encounter: Payer: Self-pay | Admitting: Oncology

## 2013-03-27 DIAGNOSIS — C342 Malignant neoplasm of middle lobe, bronchus or lung: Secondary | ICD-10-CM

## 2013-03-27 DIAGNOSIS — C3430 Malignant neoplasm of lower lobe, unspecified bronchus or lung: Secondary | ICD-10-CM

## 2013-03-27 HISTORY — DX: Malignant neoplasm of middle lobe, bronchus or lung: C34.2

## 2013-03-27 NOTE — Progress Notes (Signed)
Hematology and Oncology Follow Up Visit  Jose Gordon [id number] 06/09/1929 79 y.o. 03/27/2013 4:49 PM   Principle Diagnosis: Encounter Diagnosis  Name Primary?  . 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 Initial right lower lobe lesion resected in 2005. Upper lobe lesion resected in 2009. Course complicated by postoperative lung abscess. He has never required chemotherapy or radiation therapy. He recently presented to his internist with a six-week history of cough. Chest radiograph suggested pneumonia. However pneumonia did not resolve on antibiotic therapy. A CT scan was ordered. I have reviewed these images with the patient and his wife today. He has a new 3.1 x 2.6 cm mass in the right middle lobe. There is an associated postobstructive pneumonia. There are borderline enlarged right paratracheal and subcarinal lymph nodes and prominent right hilar soft tissue suspicious for lymph node involvement. His cough has resolved on recent antibiotics. He has no other symptoms.  Medications: reviewed  Allergies:  Allergies  Allergen Reactions  . Iohexol      Desc: remote allergy of rash 20 yrs ago. has had since with no pre-meds and no problems     Review of Systems: Constitutional:   No constitutional symptoms Respiratory: See above. He sawsmall flecks of blood at the time that he had his cough.; He gets dyspnea on mild exertion Cardiovascular: No chest pain or palpitations  Gastrointestinal: No abdominal pain or change in bowel habit Genito-Urinary: Nocturia, slow stream  Musculoskeletal: No muscle bone or joint pain Neurologic: No headache or change in vision Skin: No rash or ecchymosis Remaining ROS negative.  Physical Exam: Blood pressure 150/71, pulse 65, temperature 98.3 F (36.8 C), temperature source Oral, resp. rate 18, height 6\' 1"   (1.854 m), weight 247 lb 6.4 oz (112.22 kg), SpO2 96.00%. Wt Readings from Last 3 Encounters:  03/27/13 247 lb 6.4 oz (112.22 kg)  04/01/12 245 lb 12.8 oz (111.494 kg)     General appearance: Well-nourished Caucasian man HENNT: Pharynx no erythema exudate or mass Lymph nodes: No adenopathy Breasts: Lungs: Clear to auscultation resonant to percussion Heart: Regular rhythm no murmur Abdomen: Soft, obese, nontender, no mass, no organomegaly Extremities: No edema, no calf tenderness Musculoskeletal: No joint deformities GU: Vascular: No cyanosis Neurologic: He is hard of hearing, motor strength 5 over 5, reflexes 1+ symmetric, sensation intact to vibration over the fingertips Skin: Pale, no rash, no ecchymosis  Lab Results: Lab Results  Component Value Date   WBC 6.6 03/25/2012   HGB 16.7 03/25/2012   HCT 47.2 03/25/2012   MCV 86.6 03/25/2012   PLT 204 03/25/2012     Chemistry      Component Value Date/Time   NA 136 03/25/2012 0929   NA 136 03/27/2011 0752   K 5.4* 03/25/2012 0929   K 4.6 03/27/2011 0752   CL 96* 03/25/2012 0929   CL 97 03/27/2011 0752   CO2 31 03/25/2012 0929   CO2 31 03/27/2011 0752   BUN 10 03/25/2012 0929   BUN 10 03/27/2011 0752   CREATININE 1.0 03/25/2012 0929   CREATININE 0.94 03/27/2011 0752      Component Value Date/Time   CALCIUM 9.0 03/25/2012 0929   CALCIUM 9.5 03/27/2011 0752   ALKPHOS 51 03/25/2012 0929   ALKPHOS 58 03/27/2011 0752   AST 21 03/25/2012 0929   AST 22 03/27/2011 0752   ALT 14 03/27/2011 0752  BILITOT 1.20 03/25/2012 0929   BILITOT 0.7 03/27/2011 1308       Radiological Studies: Ct Chest W Contrast  03/18/2013   *RADIOLOGY REPORT*  Clinical Data: Lung mass status post right upper lobectomy.  Now presenting with chest tightness and shortness of breath. Coarseness.  CT CHEST WITH CONTRAST  Technique:  Multidetector CT imaging of the chest was performed following the standard protocol during bolus administration of intravenous contrast.  Contrast: 80mL  OMNIPAQUE IOHEXOL 300 MG/ML  SOLN  Comparison: Multiple priors, most recently 03/25/2012.  Findings:  Mediastinum: Heart size is normal. There is no significant pericardial fluid, thickening or pericardial calcification. There is atherosclerosis of the thoracic aorta, the great vessels of the mediastinum and the coronary arteries, including calcified atherosclerotic plaque in the left main and left anterior descending coronary arteries. Calcifications of the mitral annulus. Mildly enlarged low right paratracheal lymph node measuring 12 mm in short axis. Mildly enlarged subcarinal lymph node measuring 11 mm in short axis.  Prominent lymphoid tissue in the right hilar region without definite lymphadenopathy.  No left hilar lymphadenopathy is noted.  Numerous other nonenlarged mediastinal lymph nodes are conspicuous in number rather than size (nonspecific). Esophagus is unremarkable in appearance.  Lungs/Pleura: Postoperative changes of the right upper lobectomy and apparent wedge resection in the medial aspect of the right lower lobe inferiorly are again noted.  Compared to the prior examination there has been interval development of a 3.1 x 2.6 cm mass in the upper right lung (within the right middle lobe, on image 28 of series 2).  There is an adjacent area of architectural distortion in an apparent airspace consolidation, which is favored to represent a small focus of postobstructive pneumonia, best demonstrated on image 28 of series 5. In addition, throughout the right lower lobe there are patchy areas of peribronchovascular ground-glass attenuation, consolidative air space disease and interstitial thickening, favored to represent endobronchial spread of infection.  Left lower lobe pulmonary sequestration redemonstrated.  No pleural effusions.  Upper Abdomen: Arterial supply to the left lower lobe pulmonary sequestration arises from the upper abdominal aorta.  Otherwise, unremarkable.  Musculoskeletal: Post  thoracotomy changes in the right hemithorax. There are no aggressive appearing lytic or blastic lesions noted in the visualized portions of the skeleton.  IMPRESSION:  1.  Interval development of a 3.1 x 2.6 cm mass in the right middle lobe (currently located in the upper right hemithorax) with associated postobstructive pneumonia and probable endobronchial spread of infection into the right lower lobe, as detailed above. 2.  Mildly enlarged low right paratracheal and subcarinal lymph nodes with prominent lymphoid tissue in the right hilum, suspicious for potential lymphatic metastasis.  For further evaluation of the above findings, follow-up PET-CT is recommended.  3. Atherosclerosis, including left main and left anterior descending coronary artery disease. 4.  Status post right upper lobectomy and right lower lobe wedge resection. 5.  Left lower lobe pulmonary sequestration redemonstrated.  These results will be called to the ordering clinician or representative by the Radiologist Assistant, and communication documented in the PACS Dashboard.   Original Report Authenticated By: Trudie Reed, M.D.    Impression: #1. Third primary lung cancer in a former smoker who stopped 13 years ago. He needs a biopsy and a PET scan for staging. I will arrange for these as well as radiation oncology consultation when results are available. At his age and given the previous surgery to the right  upper and ower lobes of his lung, I doubt  he will be a candidate for further surgery. He would be a candidate for radiation with or without low-dose weekly chemotherapy. I will schedule short interval followup visit to outline a more definitive treatment plan after he is staged     CC:. Dr. Gerlene Burdock Tisovec; Dr. Dorothy Puffer   Levert Feinstein, MD 7/11/20144:49 PM

## 2013-03-27 NOTE — Telephone Encounter (Signed)
appts made and printed. Pt is aware that cs will call w/ appt for CT biopsy and PET...td

## 2013-03-31 ENCOUNTER — Other Ambulatory Visit: Payer: Self-pay | Admitting: Nurse Practitioner

## 2013-03-31 ENCOUNTER — Telehealth: Payer: Self-pay | Admitting: Oncology

## 2013-03-31 NOTE — Telephone Encounter (Signed)
Received copy of 7/11 pof from LT today to look into why pet and ct bx are not yet scheduled. No pet order entered and ct bx location entered as [facility]. Notes w/pof back to desk nurse with this info. LT/JG poss w/pts.

## 2013-04-01 ENCOUNTER — Telehealth: Payer: Self-pay | Admitting: *Deleted

## 2013-04-01 ENCOUNTER — Other Ambulatory Visit: Payer: Self-pay | Admitting: Oncology

## 2013-04-01 DIAGNOSIS — C342 Malignant neoplasm of middle lobe, bronchus or lung: Secondary | ICD-10-CM

## 2013-04-01 NOTE — Telephone Encounter (Signed)
Spoke with patient.  Let him know that PET scan and CT biopsy will be scheduled and hopefully they will call him today (spoke with Jose Gordon in [facility] and they can see both orders).  Spoke with Jose Gordon and she will call Rad Onc and r/s appt with Dr. Roselind Messier after date for PET and CT Bx are set.   Let patient know that will possibly change appt. With Dr. Roselind Messier to after radilogy appts.  Wife is not there at present, but she is welcome to call back for further information if needed.

## 2013-04-02 ENCOUNTER — Telehealth: Payer: Self-pay | Admitting: Oncology

## 2013-04-02 NOTE — Telephone Encounter (Signed)
Wife returned call and confirmed new appt d/t w/Dr. Roselind Messier for 7/23. Wife also mentioned she hasn't heard any re bx. Wife informed that I would contact central to check the status of bx and ask them to call her/ wife also given direct number for central. S/w Cheryl in [facility] re bx to let her know per wife she has not heard from [facility] re bx and is waiting for their call. Per Theodoro Parma was working on bx and she would pass the message on the [place].

## 2013-04-02 NOTE — Telephone Encounter (Signed)
lmonvm for pt wife (cell - [phone]) new new appt d/t w/Dr. Roselind Messier for 04/08/13 @ 1pm. appt for 7/21 cx'd. Per desk nurse moved appt w/Dr. Roselind Messier to after 7/22 pet scan. Wife called yesterday and was made aware that I would call back w/new d/t. Cell provided by pt.

## 2013-04-03 ENCOUNTER — Encounter: Payer: Self-pay | Admitting: Radiation Oncology

## 2013-04-03 NOTE — Progress Notes (Signed)
Thoracic Location of Tumor / Histology:  RML lung  Patient presented 1.5 months ago with symptoms of: cough w/small flecks of blood, dyspnea on mild exertion  Biopsies of  (if applicable) revealed:   Tobacco/Marijuana/Snuff/ETOH use: 1 PPD 40+ years, quit 2000  Past/Anticipated interventions by cardiothoracic surgery, if any: none for this lesion, 2005 right LL lesion resected, 2009 right upper lobe lesion resected  Past/Anticipated interventions by medical oncology, if any: none in past, possible low dose chemo  Signs/Symptoms  Weight changes, if any: no  Respiratory complaints, if any: dyspnea on mild exertion  Hemoptysis, if any: none at this time  Pain issues, if any:  no  SAFETY ISSUES:  Prior radiation? no  Pacemaker/ICD? no  Possible current pregnancy? na  Is the patient on methotrexate? no  Current Complaints / other details:  Retired from [employer]

## 2013-04-06 ENCOUNTER — Ambulatory Visit: Payer: MEDICARE | Admitting: Radiation Oncology

## 2013-04-06 ENCOUNTER — Ambulatory Visit: Payer: MEDICARE

## 2013-04-07 ENCOUNTER — Ambulatory Visit (HOSPITAL_COMMUNITY)
Admission: RE | Admit: 2013-04-07 | Discharge: 2013-04-07 | Disposition: A | Discharge status: Home / Self Care | Payer: MEDICARE | Source: Ambulatory Visit | Attending: Oncology | Admitting: Oncology

## 2013-04-07 DIAGNOSIS — C342 Malignant neoplasm of middle lobe, bronchus or lung: Secondary | ICD-10-CM

## 2013-04-07 MED ORDER — FLUDEOXYGLUCOSE F - 18 (FDG) INJECTION
17.8000 | Freq: Once | INTRAVENOUS | Status: AC | PRN
  Administered 2013-04-07: 17.8 via INTRAVENOUS

## 2013-04-08 ENCOUNTER — Telehealth: Payer: Self-pay | Admitting: *Deleted

## 2013-04-08 ENCOUNTER — Ambulatory Visit
Admission: RE | Admit: 2013-04-08 | Discharge: 2013-04-08 | Disposition: A | Discharge status: Home / Self Care | Payer: MEDICARE | Source: Ambulatory Visit | Attending: Radiation Oncology | Admitting: Radiation Oncology

## 2013-04-08 ENCOUNTER — Ambulatory Visit: Payer: MEDICARE

## 2013-04-08 NOTE — Telephone Encounter (Signed)
Spoke with patient and wife and let them know that they dont see cancer spread outside of the chest and to proceed with seeing the Radiation Oncologist.  Wife asked about the biopsy.  Called radiology and they have resubmitted the CT bx request.  They wanted the results of the PET scan first.  Radiology scheduler said she would call them with appt. Either late this afternoon or early tomorrow am.  Also spoke with patient and wife and let them to know to please sign a release of information form the next time they are here so we can talk with patien/or wife about his condition.

## 2013-04-09 ENCOUNTER — Other Ambulatory Visit: Payer: Self-pay | Admitting: Radiology

## 2013-04-09 ENCOUNTER — Encounter (HOSPITAL_COMMUNITY): Payer: Self-pay

## 2013-04-09 ENCOUNTER — Inpatient Hospital Stay (HOSPITAL_COMMUNITY)
Admission: AD | Admit: 2013-04-09 | Discharge: 2013-04-10 | DRG: 920 | Disposition: A | Discharge status: Home / Self Care | Payer: MEDICARE | Source: Ambulatory Visit | Attending: Pulmonary Disease | Admitting: Pulmonary Disease

## 2013-04-09 ENCOUNTER — Inpatient Hospital Stay (HOSPITAL_COMMUNITY): Payer: MEDICARE

## 2013-04-09 VITALS — BP 149/114 | HR 90 | Temp 97.6°F | Resp 24 | Ht 73.0 in | Wt 246.9 lb

## 2013-04-09 DIAGNOSIS — Z79899 Other long term (current) drug therapy: Secondary | ICD-10-CM

## 2013-04-09 DIAGNOSIS — J4489 Other specified chronic obstructive pulmonary disease: Secondary | ICD-10-CM

## 2013-04-09 DIAGNOSIS — Y849 Medical procedure, unspecified as the cause of abnormal reaction of the patient, or of later complication, without mention of misadventure at the time of the procedure: Secondary | ICD-10-CM | POA: Diagnosis present

## 2013-04-09 DIAGNOSIS — N400 Enlarged prostate without lower urinary tract symptoms: Secondary | ICD-10-CM | POA: Diagnosis present

## 2013-04-09 DIAGNOSIS — Z87891 Personal history of nicotine dependence: Secondary | ICD-10-CM

## 2013-04-09 DIAGNOSIS — Z85118 Personal history of other malignant neoplasm of bronchus and lung: Secondary | ICD-10-CM

## 2013-04-09 DIAGNOSIS — R0902 Hypoxemia: Secondary | ICD-10-CM

## 2013-04-09 DIAGNOSIS — Z7982 Long term (current) use of aspirin: Secondary | ICD-10-CM

## 2013-04-09 DIAGNOSIS — IMO0002 Reserved for concepts with insufficient information to code with codable children: Principal | ICD-10-CM | POA: Diagnosis present

## 2013-04-09 DIAGNOSIS — E119 Type 2 diabetes mellitus without complications: Secondary | ICD-10-CM

## 2013-04-09 DIAGNOSIS — C342 Malignant neoplasm of middle lobe, bronchus or lung: Secondary | ICD-10-CM

## 2013-04-09 DIAGNOSIS — Y9210 Unspecified residential institution as the place of occurrence of the external cause: Secondary | ICD-10-CM | POA: Diagnosis present

## 2013-04-09 DIAGNOSIS — J449 Chronic obstructive pulmonary disease, unspecified: Secondary | ICD-10-CM

## 2013-04-09 DIAGNOSIS — R042 Hemoptysis: Secondary | ICD-10-CM

## 2013-04-09 DIAGNOSIS — I10 Essential (primary) hypertension: Secondary | ICD-10-CM

## 2013-04-09 LAB — BASIC METABOLIC PANEL
GFR calc Af Amer: 89 mL/min — ABNORMAL LOW (ref >90)
GFR calc non Af Amer: 76 mL/min — ABNORMAL LOW (ref >90)
Glucose, Bld: 111 mg/dL — ABNORMAL HIGH (ref 70–99)
Potassium: 5 mEq/L (ref 3.5–5.1)
Sodium: 129 mEq/L — ABNORMAL LOW (ref 135–145)

## 2013-04-09 LAB — CBC
HCT: 40.7 % (ref 39.0–52.0)
HCT: 40.1 % (ref 39.0–52.0)
Hemoglobin: 14.2 g/dL (ref 13.0–17.0)
MCH: 29.5 pg (ref 26.0–34.0)
MCH: 30 pg (ref 26.0–34.0)
MCHC: 35.6 g/dL (ref 30.0–36.0)
MCV: 83.2 fL (ref 78.0–100.0)
RBC: 4.74 MIL/uL (ref 4.22–5.81)
RBC: 4.82 MIL/uL (ref 4.22–5.81)
RDW: 13 % (ref 11.5–15.5)
WBC: 11 10*3/uL — ABNORMAL HIGH (ref 4.0–10.5)

## 2013-04-09 LAB — APTT: aPTT: 31 seconds (ref 24–37)

## 2013-04-09 LAB — PROTIME-INR: INR: 1.01 (ref 0.00–1.49)

## 2013-04-09 LAB — GLUCOSE, CAPILLARY: Glucose-Capillary: 108 mg/dL — ABNORMAL HIGH (ref 70–99)

## 2013-04-09 MED ORDER — LIDO-CAPSAICIN-MEN-METHYL SAL 0.5-0.035-5-20 % EX PTCH: 1.0000 | MEDICATED_PATCH | Freq: Every day | CUTANEOUS | Status: DC

## 2013-04-09 MED ORDER — SODIUM CHLORIDE 0.9 % IV SOLN
INTRAVENOUS | Status: DC
  Administered 2013-04-09: 12:00:00 via INTRAVENOUS

## 2013-04-09 MED ORDER — FINASTERIDE 5 MG PO TABS
5.0000 mg | ORAL_TABLET | Freq: Every day | ORAL | Status: DC
  Filled 2013-04-09: qty 1

## 2013-04-09 MED ORDER — FENTANYL CITRATE 0.05 MG/ML IJ SOLN
INTRAMUSCULAR | Status: DC | PRN
  Administered 2013-04-09: 25 ug via INTRAVENOUS
  Administered 2013-04-09 (×2): 50 ug via INTRAVENOUS

## 2013-04-09 MED ORDER — FENTANYL CITRATE 0.05 MG/ML IJ SOLN: 25.0000 ug | INTRAMUSCULAR | Status: DC | PRN

## 2013-04-09 MED ORDER — PREGABALIN 50 MG PO CAPS: 75.0000 mg | ORAL_CAPSULE | Freq: Two times a day (BID) | ORAL | Status: DC

## 2013-04-09 MED ORDER — INSULIN ASPART 100 UNIT/ML ~~LOC~~ SOLN
0.0000 [IU] | SUBCUTANEOUS | Status: DC
  Administered 2013-04-09 – 2013-04-10 (×3): 2 [IU] via SUBCUTANEOUS

## 2013-04-09 MED ORDER — FENTANYL CITRATE 0.05 MG/ML IJ SOLN
INTRAMUSCULAR | Status: AC
  Filled 2013-04-09: qty 4

## 2013-04-09 MED ORDER — SODIUM CHLORIDE 0.9 % IV SOLN: 250.0000 mL | INTRAVENOUS | Status: DC | PRN

## 2013-04-09 MED ORDER — BIOTENE DRY MOUTH MT LIQD: 15.0000 mL | Freq: Two times a day (BID) | OROMUCOSAL | Status: DC

## 2013-04-09 MED ORDER — MIDAZOLAM HCL 2 MG/2ML IJ SOLN
INTRAMUSCULAR | Status: AC
  Filled 2013-04-09: qty 4

## 2013-04-09 MED ORDER — MIRTAZAPINE 7.5 MG PO TABS
7.5000 mg | ORAL_TABLET | Freq: Every day | ORAL | Status: DC
  Filled 2013-04-09: qty 1

## 2013-04-09 MED ORDER — MONTELUKAST SODIUM 10 MG PO TABS
10.0000 mg | ORAL_TABLET | Freq: Every day | ORAL | Status: DC
  Filled 2013-04-09: qty 1

## 2013-04-09 MED ORDER — MIDAZOLAM HCL 2 MG/2ML IJ SOLN
INTRAMUSCULAR | Status: DC | PRN
  Administered 2013-04-09 (×3): 1 mg via INTRAVENOUS

## 2013-04-09 MED ORDER — SODIUM CHLORIDE 0.9 % IV SOLN
INTRAVENOUS | Status: DC
  Administered 2013-04-09: 08:00:00 via INTRAVENOUS

## 2013-04-09 MED ORDER — PANTOPRAZOLE SODIUM 40 MG PO TBEC: 40.0000 mg | DELAYED_RELEASE_TABLET | Freq: Every day | ORAL | Status: DC

## 2013-04-09 MED ORDER — ALPRAZOLAM 0.5 MG PO TABS: 0.5000 mg | ORAL_TABLET | Freq: Every day | ORAL | Status: DC

## 2013-04-09 MED ORDER — ALBUTEROL SULFATE (5 MG/ML) 0.5% IN NEBU
2.5000 mg | INHALATION_SOLUTION | Freq: Four times a day (QID) | RESPIRATORY_TRACT | Status: DC
  Administered 2013-04-09 – 2013-04-10 (×4): 2.5 mg via RESPIRATORY_TRACT
  Filled 2013-04-09 (×4): qty 0.5

## 2013-04-09 MED ORDER — IPRATROPIUM BROMIDE 0.02 % IN SOLN
0.5000 mg | Freq: Four times a day (QID) | RESPIRATORY_TRACT | Status: DC
  Administered 2013-04-09 – 2013-04-10 (×4): 0.5 mg via RESPIRATORY_TRACT
  Filled 2013-04-09 (×4): qty 2.5

## 2013-04-09 MED ORDER — ALPRAZOLAM 0.5 MG PO TABS
0.5000 mg | ORAL_TABLET | Freq: Every evening | ORAL | Status: DC | PRN
  Administered 2013-04-09: 0.5 mg via ORAL
  Filled 2013-04-09: qty 1

## 2013-04-09 MED ORDER — CALCIUM CARBONATE-VITAMIN D 500-200 MG-UNIT PO TABS
1.0000 | ORAL_TABLET | Freq: Two times a day (BID) | ORAL | Status: DC
  Filled 2013-04-09 (×2): qty 1

## 2013-04-09 NOTE — ED Notes (Signed)
Large amount of hemoptysis.  Movbed to stretcher right side down.  Placed on 100% non rebreather.  Rapid response team called.  Sats up tp 95 %.

## 2013-04-09 NOTE — ED Notes (Signed)
Pt greeted.  Procedure and sedation explained to pt and wife.

## 2013-04-09 NOTE — ED Notes (Signed)
Scouting pictures being obtained.

## 2013-04-09 NOTE — Significant Event (Signed)
Pt's hemoglobin stable.  Will change frequency of CBC's.  C/o insomnia.  Uses xanax at home.  Will order prn xanax.  Coralyn Helling, MD 04/09/2013, 7:52 PM

## 2013-04-09 NOTE — ED Notes (Signed)
Patient denies pain and is resting comfortably.  

## 2013-04-09 NOTE — ED Notes (Signed)
Report off to ICU RN, stable.  No further hemoptysis.

## 2013-04-09 NOTE — ED Notes (Signed)
CCM MD here, Dr Molli Knock.  Will admit to ICU.  Hemoptysis essentially stopped.  Sat 100% on non rebreather.  Pt alert, explanation of events given and understands events.

## 2013-04-09 NOTE — Progress Notes (Signed)
1116  Received call to assess patient experiencing hemoptysis s/p lung biopsy in cat scan #3.  On arrival pt alert, lying on right side on stretcher receiving O2 via NRmask at 10 l with  O2 sats 98% Dr. Rica Records present and reports patient started with sudden hemoptysis post right middle lung biopsy for recurrent cancer.  Approximately 200 cc BRBlood noted in cannister with large amount blood on bed linen. Airway patent. No active hemoptysis at present -  SR 89 on monitor with SBP 130's.  RR 22.  Pt denies CP, SOB, nausea. BS decreased left and right - but even air entry.  abd soft without c/o abd pain.   Dr Molli Knock and RT arrived .  ICU bed requested.  Report called by  CT RN Marjean Donna to RN on  [id number].  Pt transported via CT RN due to CODE [facility] page to [phone].

## 2013-04-09 NOTE — ED Notes (Signed)
Dr Deanne Coffer here.  Repositioned on to back.

## 2013-04-09 NOTE — H&P (Signed)
Chief Complaint: "I am here for a right lung biopsy." Referring Physician: Dr. Cyndie Chime HPI: Jose Gordon is an 79 y.o. male with PMHx of non-small cell lung cancer s/p RUL and RLL resection alone. Patient states he is 4 years in remission. Patient was c/o of progressive shortness of breath and cough with sputum and minimal blood 1 month ago. He had a CT performed 03/18/13 that revealed a new RML mass as well as associated postobstructive pneumonia. Patient states he was treated with antibiotics and his cough has improved. He denies any fever, chills or hemopytsis. He denies any chest pain, blood in his stool or urine. He does c/o shortness of breath and is here today for RML biopsy.  Past Medical History:  Past Medical History  Diagnosis Date  . COPD (chronic obstructive pulmonary disease) 04/01/2012  . HTN (hypertension), benign 04/01/2012  . DM type 2 (diabetes mellitus, type 2) 04/01/2012  . Lung cancer, upper lobe 04/01/2012    T2N0 IB resected Jan,2005  . Lung cancer, lower lobe 04/01/2012    T1aN0M0 RLL resected June,2009  . Lung cancer, middle lobe 03/27/2013    Right side  3.1 cm 03/18/13 CT    Past Surgical History:  Past Surgical History  Procedure Laterality Date  . Prostate surgery    . Cataract surgery    . Knee surgery Left   . Nephrectomy      Family History: No family history on file.  Social History:  reports that he quit smoking about 14 years ago. His smoking use included Cigarettes. He has a 40 pack-year smoking history. He does not have any smokeless tobacco history on file. He reports that he does not drink alcohol or use illicit drugs.  Allergies:  Allergies  Allergen Reactions  . Iohexol      Desc: remote allergy of rash 20 yrs ago. has had since with no pre-meds and no problems       Medication List    ASK your doctor about these medications       ALPRAZolam 0.5 MG tablet  Commonly known as:  XANAX  Take 0.5 mg by mouth daily.     ANORO ELLIPTA  62.5-25 MCG/INH Aepb  Generic drug:  Umeclidinium-Vilanterol  Inhale into the lungs daily.     aspirin 81 MG tablet  Take 81 mg by mouth daily.     budesonide-formoterol 160-4.5 MCG/ACT inhaler  Commonly known as:  SYMBICORT  Inhale 2 puffs into the lungs 2 (two) times daily.     calcium-vitamin D 500-200 MG-UNIT per tablet  Commonly known as:  OSCAL WITH D  Take 1 tablet by mouth 2 (two) times daily.     finasteride 5 MG tablet  Commonly known as:  PROSCAR  Take 5 mg by mouth daily.     GLUCOSAMINE-CHONDROITIN PO  Take 1 tablet by mouth 2 (two) times daily. 1500/1200 mg     ICAPS MV PO  Take 2 capsules by mouth 2 (two) times daily. With breakfast & supper     losartan 50 MG tablet  Commonly known as:  COZAAR  Take 50 mg by mouth daily.     MEDI-PATCH-LIDOCAINE 0.5-0.035-5-20 % Ptch  Generic drug:  Lido-Capsaicin-Men-Methyl Sal  Apply 1 patch topically daily.     metoprolol tartrate 25 MG tablet  Commonly known as:  LOPRESSOR  Take 25 mg by mouth 2 (two) times daily.     mirtazapine 7.5 MG tablet  Commonly known as:  REMERON  Take  7.5 mg by mouth daily.     montelukast 10 MG tablet  Commonly known as:  SINGULAIR  Take 10 mg by mouth daily.     omeprazole 20 MG capsule  Commonly known as:  PRILOSEC  Take 20 mg by mouth daily.     pregabalin 75 MG capsule  Commonly known as:  LYRICA  Take 75 mg by mouth 2 (two) times daily.        Please HPI for pertinent positives, otherwise complete 10 system ROS negative.  Physical Exam: BP 185/74  Pulse 60  Temp(Src) 97.5 F (36.4 C) (Oral)  Resp 20  Ht 6\' 1"  (1.854 m)  Wt 247 lb (112.038 kg)  BMI 32.59 kg/m2  SpO2 98% Body mass index is 32.59 kg/(m^2).   General Appearance:  Alert, cooperative, no distress, appears stated age  Head:  Normocephalic, without obvious abnormality, atraumatic  Neck: Supple, symmetrical, trachea midline  Lungs:   Clear to auscultation on right with diminished breath sounds with  left sided expiratory wheezes. Respirations unlabored without use of accessory muscles.  Chest Wall:  No tenderness or deformity  Heart:  Regular rate and rhythm, S1, S2 normal, no murmur, rub or gallop.  Abdomen:   Soft, non-tender, non distended.  Extremities: Extremities atraumatic, trace pitting edema bilaterally, no cyanosis  Pulses: 1+ and symmetric  Neurologic: Normal affect, no gross deficits.   Results for orders placed during the hospital encounter of 04/07/13 (from the past 48 hour(s))  GLUCOSE, CAPILLARY     Status: Abnormal   Collection Time    04/07/13  9:52 AM      Result Value Range   Glucose-Capillary 105 (*) 70 - 99 mg/dL   Nm Pet Image Restag (ps) Skull Base To Thigh  04/07/2013   *RADIOLOGY REPORT*  Clinical Data: Initial treatment strategy for new primary lung cancer. Prior history of metachronous right lung cancers status post resection.  NUCLEAR MEDICINE PET SKULL BASE TO THIGH  Fasting Blood Glucose:  105  Technique:  17.8 mCi F-18 FDG was injected intravenously. CT data was obtained and used for attenuation correction and anatomic localization only.  (This was not acquired as a diagnostic CT examination.) Additional exam technical data entered on technologist worksheet.  Comparison:  CT chest dated 03/18/2013  Findings:  Neck: No hypermetabolic lymph nodes in the neck.  Chest:  Status post right upper lobectomy and medial right lower lobe wedge resection.  4.2 x 3.4 cm mass in the right middle lobe (series 2/image 82), max SUV 27.4, compatible with primary bronchogenic neoplasm.  Small mediastinal lymph nodes, similar to prior studies but mildly FDG-avid above blood pool, including: --7 mm short-axis prevascular node (series 2/image 79), max SUV 3.0 --12 mm short-axis precarinal node (series 2/image 80), max SUV 4.6 --12 mm short-axis subcarinal node (series 2/image 95), max SUV 4.2  Coronary atherosclerosis.  Atherosclerotic calcifications of the aortic arch.   Abdomen/Pelvis:  No abnormal hypermetabolic activity within the liver, pancreas, adrenal glands, or spleen.  No hypermetabolic lymph nodes in the abdomen or pelvis.  Atherosclerotic calcifications of the abdominal aorta and branch vessels.  Skeleton:  No focal hypermetabolic activity to suggest skeletal metastasis.  Postsurgical changes related to prior lumbar fixation.  IMPRESSION: Status post right upper lobectomy and medial right lower lobe wedge resection.  4.2 cm right middle lobe mass, max SUV 27.4, compatible with primary bronchogenic neoplasm.  Small mediastinal measuring up to 12 mm short axis, mildly FDG-avid but similar to prior studies, nodal  metastases not excluded.  Otherwise, no evidence of metastatic disease.   Original Report Authenticated By: Charline Bills, M.D.    Assessment/Plan History of Metachronous primary non-small cell Lung cancer s/p RUL and RLL resection alone. Patient follows with Dr. Cyndie Chime and has been in remission 4 years since last surgery.  New RML mass on CT 03/18/13 compared to 03/2012 CT with new progressive symptoms. Patient scheduled for CT guided RML biopsy today. Labs reviewed. Risks and Benefits discussed with the patient and his wife. All of the patient's questions were answered, patient is agreeable to proceed. Consent signed and in chart.   Pattricia Boss D PA-C 04/09/2013, 8:48 AM

## 2013-04-09 NOTE — H&P (Signed)
PULMONARY  / CRITICAL CARE MEDICINE  Name: Jose Gordon MRN: [id number] DOB: Mar 14, 1929    ADMISSION DATE:  04/09/2013  REFERRING MD :  Radiology  PRIMARY SERVICE: PCCM   CHIEF COMPLAINT: Hemoptysis post RML biopsy    BRIEF PATIENT DESCRIPTION: Jose Gordon, an 79 y/o white male w/ history of lung CA, presents with hemoptysis post anterior approach RML biopsy performed  in interventional radiology today.   SIGNIFICANT EVENTS / STUDIES:  7/24>>> RML Lung Biopsy with complication of hemoptysis anterior approach  LINES / TUBES: PIV  CULTURES: None  ANTIBIOTICS: None  HISTORY OF PRESENT ILLNESS:  Jose Gordon, and 79 y/o white male w/history of lung CA, presents with hemoptysis post RML biopsy performed in interventional radiology today. After the procedure the patient coughed up approximately 400 cc's of blood. The patient is awake and alert and in no apparent distress. He is wearing a NRB on 10L w/ O2 sat of 100%. Currently, the patient is hemodynamically stable. He denies chest pain and states he is always SOB but does not feel that has worsened since the procedure. He feels his breathing is better when he is sitting up rather than lying down. He has no other complaints at this time.   PAST MEDICAL HISTORY :  Past Medical History  Diagnosis Date  . COPD (chronic obstructive pulmonary disease) 04/01/2012  . HTN (hypertension), benign 04/01/2012  . DM type 2 (diabetes mellitus, type 2) 04/01/2012  . Lung cancer, upper lobe 04/01/2012    T2N0 IB resected Jan,2005  . Lung cancer, lower lobe 04/01/2012    T1aN0M0 RLL resected June,2009  . Lung cancer, middle lobe 03/27/2013    Right side  3.1 cm 03/18/13 CT   Past Surgical History  Procedure Laterality Date  . Prostate surgery    . Cataract surgery    . Knee surgery Left   . Nephrectomy     Prior to Admission medications   Medication Sig Start Date End Date Taking? Authorizing Provider  ALPRAZolam Prudy Feeler) 0.5 MG tablet Take  0.5 mg by mouth daily.   Yes Historical Provider, MD  aspirin 81 MG tablet Take 81 mg by mouth daily.   Yes Historical Provider, MD  budesonide-formoterol (SYMBICORT) 160-4.5 MCG/ACT inhaler Inhale 2 puffs into the lungs 2 (two) times daily.   Yes Historical Provider, MD  calcium-vitamin D (OSCAL WITH D) 500-200 MG-UNIT per tablet Take 1 tablet by mouth 2 (two) times daily.   Yes Historical Provider, MD  finasteride (PROSCAR) 5 MG tablet Take 5 mg by mouth daily.   Yes Historical Provider, MD  GLUCOSAMINE-CHONDROITIN PO Take 1 tablet by mouth 2 (two) times daily. 1500/1200 mg   Yes Historical Provider, MD  Lido-Capsaicin-Men-Methyl Sal (MEDI-PATCH-LIDOCAINE) 0.5-0.035-5-20 % PTCH Apply 1 patch topically daily.   Yes Historical Provider, MD  losartan (COZAAR) 50 MG tablet Take 50 mg by mouth daily.   Yes Historical Provider, MD  metoprolol tartrate (LOPRESSOR) 25 MG tablet Take 25 mg by mouth 2 (two) times daily.   Yes Historical Provider, MD  mirtazapine (REMERON) 7.5 MG tablet Take 7.5 mg by mouth daily.   Yes Historical Provider, MD  montelukast (SINGULAIR) 10 MG tablet Take 10 mg by mouth daily.   Yes Historical Provider, MD  Multiple Vitamins-Minerals (ICAPS MV PO) Take 2 capsules by mouth 2 (two) times daily. With breakfast & supper   Yes Historical Provider, MD  omeprazole (PRILOSEC) 20 MG capsule Take 20 mg by mouth daily.   Yes Historical Provider,  MD  pregabalin (LYRICA) 75 MG capsule Take 75 mg by mouth 2 (two) times daily.   Yes Historical Provider, MD  Umeclidinium-Vilanterol (ANORO ELLIPTA) 62.5-25 MCG/INH AEPB Inhale into the lungs daily.   Yes Historical Provider, MD   Allergies  Allergen Reactions  . Iohexol      Desc: remote allergy of rash 20 yrs ago. has had since with no pre-meds and no problems     FAMILY HISTORY:  No family history on file. SOCIAL HISTORY:  reports that he quit smoking about 14 years ago. His smoking use included Cigarettes. He has a 40 pack-year  smoking history. He does not have any smokeless tobacco history on file. He reports that he does not drink alcohol or use illicit drugs.  REVIEW OF SYSTEMS: Unable to perform full ROS   SUBJECTIVE: WDWN 79 year old male, not acutely distressed with current hemoptysis   VITAL SIGNS: Temp:  [97.5 F (36.4 C)] 97.5 F (36.4 C) (07/24 0801) Pulse Rate:  [59-154] 77 (07/24 1137) Resp:  [13-20] 14 (07/24 1137) BP: (130-185)/(47-116) 135/47 mmHg (07/24 1137) SpO2:  [76 %-100 %] 100 % (07/24 1137) Weight:  [112.038 kg (247 lb)] 112.038 kg (247 lb) (07/24 0801) HEMODYNAMICS:   VENTILATOR SETTINGS:   INTAKE / OUTPUT: Intake/Output   None    PHYSICAL EXAMINATION: General: WDWN elderly male with +hemoptysis   Neuro:  Alert and oriented. Gross motor movement intact.  HEENT: PERRLA, EOMI, trachea midline.  Cardiovascular: RRR no murmers Lungs: Course lung sounds bilaterally Abdomen:  Soft, non tender, non distended, no masses Musculoskeletal: No gross joint deformity Skin:  Warm, pink, dry.   LABS:  Recent Labs Lab 04/09/13 0821  HGB 14.5  WBC 6.3  PLT 198  APTT 31  INR 1.01   Recent Labs Lab 04/07/13 0952  GLUCAP 105*   CXR: Pending  ASSESSMENT / PLAN:  PULMONARY A: Regional Alveolar Hemorrhage post RML biopsy COPD S/P Left Lung Carcinoma x 2 (2005, 2009) P:   - Monitor BP and HR for hemodynamic stability - Give nebs  - Continue Montelukast   CARDIOVASCULAR A: History of HTN P:  - Hold cozaar and lopressor now - Monitor BP. Will continue home BP meds if patient becomes hypertensive and remains hemodynamically stable.    RENAL A:   History of BPH No acute issues P:   - Continue finesteride for BPH  - BMET now and in AM, adjust electrolyte imbalances as needed   GASTROINTESTINAL A:   No acute issues P:   -Keep patient NPO except sips with meds   HEMATOLOGIC A: Active bleeding, hemodynamically stable.  P:  - Hemoglobin OK this AM, CBC q6 hrs to  monitor.   INFECTIOUS A:   No acute issues  P:   - Monitor WBC trend and fever curve  ENDOCRINE A:  History of DM2 P:   - SSI  NEUROLOGIC A:  No acute issues P:   - PRN fentyl for pain management  Jennifer Little PA-S  I have personally obtained a history, examined the patient, evaluated laboratory and imaging results, formulated the assessment and plan and placed orders.  CRITICAL CARE: The patient is critically ill with multiple organ systems failure and requires high complexity decision making for assessment and support, frequent evaluation and titration of therapies, application of advanced monitoring technologies and extensive interpretation of multiple databases. Critical Care Time devoted to patient care services described in this note is 55 minutes.   Alyson Reedy, M.D. Pulmonary  and Critical Care Medicine [facility] Pager: [phone]  04/09/2013, 12:00 PM

## 2013-04-09 NOTE — ED Notes (Signed)
O2 @ 2L [place] applied

## 2013-04-09 NOTE — ED Notes (Signed)
Ready for TX to 2100.  Pt stable, NO further hemoptysis.  VSS

## 2013-04-09 NOTE — Procedures (Signed)
CT lung core bx RML 18g x3 Regional alveolar hemorrhage, no ptx CXR in 1 hr, observation.

## 2013-04-10 ENCOUNTER — Inpatient Hospital Stay (HOSPITAL_COMMUNITY): Payer: MEDICARE

## 2013-04-10 ENCOUNTER — Encounter (HOSPITAL_COMMUNITY): Payer: Self-pay

## 2013-04-10 LAB — BASIC METABOLIC PANEL
BUN: 14 mg/dL (ref 6–23)
CO2: 29 mEq/L (ref 19–32)
Calcium: 9 mg/dL (ref 8.4–10.5)
Creatinine, Ser: 0.9 mg/dL (ref 0.50–1.35)
GFR calc non Af Amer: 77 mL/min — ABNORMAL LOW (ref >90)
Glucose, Bld: 126 mg/dL — ABNORMAL HIGH (ref 70–99)
Sodium: 128 mEq/L — ABNORMAL LOW (ref 135–145)

## 2013-04-10 LAB — CBC
HCT: 36.7 % — ABNORMAL LOW (ref 39.0–52.0)
MCH: 29.3 pg (ref 26.0–34.0)
MCHC: 35.1 g/dL (ref 30.0–36.0)
RDW: 13 % (ref 11.5–15.5)

## 2013-04-10 LAB — GLUCOSE, CAPILLARY
Glucose-Capillary: 122 mg/dL — ABNORMAL HIGH (ref 70–99)
Glucose-Capillary: 147 mg/dL — ABNORMAL HIGH (ref 70–99)

## 2013-04-10 LAB — PROTIME-INR: INR: 1.04 (ref 0.00–1.49)

## 2013-04-10 NOTE — Discharge Summary (Signed)
Physician Discharge Summary  Patient ID: Jose Gordon MRN: [id number] DOB/AGE: 77-Jan-1930 77 y.o.  Admit date: 04/09/2013 Discharge date: 04/10/2013    Discharge Diagnoses:  Hemoptysis s/p anterior approach lung biopsy Lung cancer                                            Hospital Course:  Jose Gordon is a 79 y.o. y/o male with a PMH of recurrent lung cancer who was admitted to the Medical Intensive Care Unit due to large volume hemoptysis after an anterior approach CT guided biopsy of a RML lung mass. He was admitted and monitored overnight closely for any signs of hemodynamic instability. Oxygen was supplemented as needed. The morning after admission he was breathing comfortably, did not require any supplemental oxygen, and his hemoptysis had stopped so he was deemed stable for discharge. He will follow up with his oncologist, Dr. Cyndie Chime, as an outpatient. He was instructed to not take his aspirin for 48 hours after discharge due to the hemoptysis. He was given the contact information for the [facility], which he can follow up with as an outpatient for pulmonary evaluation if desired. He seems to be on duplicate LABA therapy & would recommend stop symbicort.           SIGNIFICANT DIAGNOSTIC STUDIES none  MICRO DATA  none  ANTIBIOTICS none  CONSULTS Interventional Radiology  TUBES / LINES PIV's  Discharge Exam: General: NAD Neuro: grossly nonfocal, speech intact CV:  RRR PULM: mild rales RLL, otherwise clear GI: abd soft, nttp Extremities: atraumatic  Filed Vitals:   04/10/13 0855 04/10/13 0900 04/10/13 0922 04/10/13 1212  BP:  149/114    Pulse:  90    Temp: 98.1 F (36.7 C)   97.6 F (36.4 C)  TempSrc: Oral   Oral  Resp:  24    Height:      Weight:      SpO2:  96% 98%      Discharge Labs  BMET  Recent Labs Lab 04/09/13 1259 04/10/13 0330  NA 129* 128*  K 5.0 4.8  CL 95* 92*  CO2 27 29  GLUCOSE 111* 126*  BUN 12 14   CREATININE 0.92 0.90  CALCIUM 9.1 9.0    CBC  Recent Labs Lab 04/09/13 1259 04/09/13 1816 04/10/13 0330  HGB 14.2 14.2 12.9*  HCT 39.4 40.1 36.7*  WBC 6.6 11.0* 10.6*  PLT 203 172 187    Anti-Coagulation  Recent Labs Lab 04/09/13 0821 04/10/13 0330  INR 1.01 1.04    Discharge Orders   Future Appointments Provider Department Dept Phone   04/17/2013 4:00 PM Levert Feinstein, MD [facility] MEDICAL ONCOLOGY [phone]   04/27/2013 10:30 AM [facility] Nurse [facility] RADIATION ONCOLOGY [phone]   04/27/2013 11:00 AM Billie Lade, MD [facility] RAD[facility]ATION ONCOLOGY [phone]   Future Orders Complete By Expires     Discharge patient  As directed         Follow-up Information   Follow up with Levert Feinstein, MD On 04/17/2013. (at 4:00pm)    Contact information:   [address] [place] [address] [phone]          Medication List    STOP taking these medications       aspirin 81 MG tablet  TAKE these medications       ALPRAZolam 0.5 MG tablet  Commonly known as:  XANAX  Take 0.5 mg by mouth daily.     ANORO ELLIPTA 62.5-25 MCG/INH Aepb  Generic drug:  Umeclidinium-Vilanterol  Inhale into the lungs daily.     budesonide-formoterol 160-4.5 MCG/ACT inhaler  Commonly known as:  SYMBICORT  Inhale 2 puffs into the lungs 2 (two) times daily.     calcium-vitamin D 500-200 MG-UNIT per tablet  Commonly known as:  OSCAL WITH D  Take 1 tablet by mouth 2 (two) times daily.     finasteride 5 MG tablet  Commonly known as:  PROSCAR  Take 5 mg by mouth daily.     GLUCOSAMINE-CHONDROITIN PO  Take 1 tablet by mouth 2 (two) times daily. 1500/1200 mg     ICAPS MV PO  Take 2 capsules by mouth 2 (two) times daily. With breakfast & supper     losartan 50 MG tablet  Commonly known as:  COZAAR  Take 50 mg by mouth daily.     MEDI-PATCH-LIDOCAINE 0.5-0.035-5-20 % Ptch  Generic drug:   Lido-Capsaicin-Men-Methyl Sal  Apply 1 patch topically daily.     metoprolol tartrate 25 MG tablet  Commonly known as:  LOPRESSOR  Take 25 mg by mouth 2 (two) times daily.     mirtazapine 7.5 MG tablet  Commonly known as:  REMERON  Take 7.5 mg by mouth daily.     montelukast 10 MG tablet  Commonly known as:  SINGULAIR  Take 10 mg by mouth daily.     omeprazole 20 MG capsule  Commonly known as:  PRILOSEC  Take 20 mg by mouth daily.     pregabalin 75 MG capsule  Commonly known as:  LYRICA  Take 75 mg by mouth 2 (two) times daily.          Disposition: to home  Discharged Condition: Jose Gordon has met maximum benefit of inpatient care and is medically stable and cleared for discharge.  Patient is pending follow up as above.      Signed: Levert Feinstein, MD Family Medicine PGY-2   Oretha Milch.

## 2013-04-10 NOTE — Progress Notes (Signed)
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 Now with mass in RML!  PET scan with minimal uptake in mediastinum, significant uptake in primary lung mass.  I referred him for a needle biopsy of the lung lesion to confirm dx and get tissue for EGFR/ALK gene status.  He developed hemoptysis post procedure, approximately 200 ml blood. Hb 14.2 pre-procedure, 12.9 this AM.  He was admitted for overnight observation.  Hemoptysis now stopped.  He appears comfortable, skin pink, acyanotic, good air movement throughout both lungs. Regular cardiac rhythm. Not tachycardic.  Discussed w critical care - he is stable for discharge today - he has follow up with me next Friday. Wife at bedside. Status reviewed.  Thanks!

## 2013-04-10 NOTE — Care Management Note (Signed)
    Page 1 of 1   04/10/2013     12:00:16 PM   CARE MANAGEMENT NOTE 04/10/2013  Patient:  Jose Gordon, Jose Gordon   Account Number:  [id number]  Date Initiated:  04/09/2013  Documentation initiated by:  [facility]  Subjective/Objective Assessment:   Hemoptysis post lung biopsy.  Lives with wife.     Action/Plan:   Anticipated DC Date:  04/10/2013   Anticipated DC Plan:  HOME/SELF CARE      DC Planning Services  CM consult      Choice offered to / List presented to:             Status of service:  In process, will continue to follow Medicare Important Message given?   (If response is "NO", the following Medicare IM given date fields will be blank) Date Medicare IM given:   Date Additional Medicare IM given:    Discharge Disposition:    Per UR Regulation:  Reviewed for med. necessity/level of care/duration of stay  If discussed at Long Length of Stay Meetings, dates discussed:    Comments:  04-10-13 Dana Allan, RNBSN [phone] Talked with patient and wife in room.  Patient sitting up eating breakfast.  Plan for discharge today.  Prior to admission lived at home with spouse.  Both independent. Both state do not have any needs at home.

## 2013-04-10 NOTE — Progress Notes (Signed)
PULMONARY  / CRITICAL CARE MEDICINE  Name: Jose Gordon MRN: [id number] DOB: 15-Feb-1929    ADMISSION DATE:  04/09/2013  REFERRING MD :  Radiology  PRIMARY SERVICE: PCCM   CHIEF COMPLAINT: Hemoptysis post RML biopsy    BRIEF PATIENT DESCRIPTION: Jose Gordon, an 79 y/o white male w/ history of lung CA, presents with hemoptysis post anterior approach RML biopsy performed  in interventional radiology today.   SIGNIFICANT EVENTS / STUDIES:  7/24>>> RML Lung Biopsy with complication of hemoptysis anterior approach  LINES / TUBES: PIV  CULTURES: None  ANTIBIOTICS: None  SUBJECTIVE: pt reports improvement in symptoms. Breathing normally, no further hemoptysis since 7pm last night  VITAL SIGNS: Temp:  [97.3 F (36.3 C)-98.5 F (36.9 C)] 98.1 F (36.7 C) (07/25 0855) Pulse Rate:  [59-154] 77 (07/25 0700) Resp:  [13-27] 21 (07/25 0700) BP: (128-170)/(46-116) 146/60 mmHg (07/25 0700) SpO2:  [76 %-100 %] 92 % (07/25 0700) FiO2 (%):  [100 %] 100 % (07/24 1330) Weight:  [246 lb 14.6 oz (112 kg)] 246 lb 14.6 oz (112 kg) (07/25 0500) HEMODYNAMICS:   VENTILATOR SETTINGS: Vent Mode:  [-]  FiO2 (%):  [100 %] 100 % INTAKE / OUTPUT: Intake/Output     07/24 0701 - 07/25 0700 07/25 0701 - 07/26 0700   I.V. (mL/kg) 1000 (8.9)    Total Intake(mL/kg) 1000 (8.9)    Urine (mL/kg/hr) 1650    Total Output 1650     Net -650           PHYSICAL EXAMINATION: General: NAD, sitting up in bed, wife at bedside Neuro:  Alert and oriented. Gross motor movement intact.  HEENT: NCAT Cardiovascular: RRR Lungs: CTAB except mild rales in RLL Abdomen:  Soft, non tender, non distended Musculoskeletal: No gross joint deformity or lower extremity edema Skin:  Warm, pink, dry.   LABS:  Recent Labs Lab 04/09/13 0821 04/09/13 1259 04/09/13 1816 04/10/13 0330  HGB 14.5 14.2 14.2 12.9*  WBC 6.3 6.6 11.0* 10.6*  PLT 198 203 172 187  NA  --  129*  --  128*  K  --  5.0  --  4.8  CL  --  95*   --  92*  CO2  --  27  --  29  GLUCOSE  --  111*  --  126*  BUN  --  12  --  14  CREATININE  --  0.92  --  0.90  CALCIUM  --  9.1  --  9.0  APTT 31  --   --   --   INR 1.01  --   --  1.04    Recent Labs Lab 04/09/13 1209 04/09/13 1600 04/09/13 2018 04/10/13 0028 04/10/13 0338  GLUCAP 108* 108* 121* 123* 122*   CXR: Postsurgical changes, airspace disease present in R lung  ASSESSMENT / PLAN:  PULMONARY A: Regional Alveolar Hemorrhage post RML biopsy COPD S/P Left Lung Carcinoma x 2 (2005, 2009) P:   - now with no O2 requirement, stable, reports improvement in symptoms, no further hemoptysis since 7pm last night - d/c home today if cleared by IR -Plan is for Rad Onc evaluation for SBRT - we can follow for pulmonary management if desired. Needs PFT assessment as out pt once hemoptysis resolved , can dc symbicort while on Anoro  CARDIOVASCULAR A: History of HTN P:  - continue home BP meds upon d/c  RENAL A:   History of BPH No acute issues P:   -  Continue finesteride for BPH  Continue home meds upon d/c  GASTROINTESTINAL A:   No acute issues P:   -carb modified diet  HEMATOLOGIC A: Active bleeding, hemodynamically stable.  P:  hgb stable for d/c  INFECTIOUS A:   No acute issues  P:   No signs of infxn  ENDOCRINE A:  History of DM2 P:   - SSI, resume home regimen upon d/c  NEUROLOGIC A:  No acute issues P:   - PRN fentyl for pain management while hospitalized  Jose Feinstein, MD Family Medicine PGY-2   Care during the described time interval was provided by me and/or other providers on the critical care team.  I have reviewed this patient's available data, including medical history, events of note, physical examination and test results as part of my evaluation  Jose Gordon V.

## 2013-04-10 NOTE — Progress Notes (Signed)
IR follow up. S/p RML biopsy yesterday. Post procedure hemoptysis, admitted by PCCM for observation No further hemoptysis, feels well, denies CP, SOB BP 149/114  Pulse 90  Temp(Src) 98.1 F (36.7 C) (Oral)  Resp 24  Ht 6\' 1"  (1.854 m)  Wt 246 lb 14.6 oz (112 kg)  BMI 32.58 kg/m2  SpO2 98% Lungs: clear, puncture site clean, no hematoma. Hgb: 12.9  Apprecaite PCCM help and observation. Agree pt is stable for discharge this am.  Brayton El PA-C Interventional Radiology 04/10/2013 9:35 AM

## 2013-04-13 LAB — GLUCOSE, CAPILLARY: Glucose-Capillary: 121 mg/dL — ABNORMAL HIGH (ref 70–99)

## 2013-04-17 ENCOUNTER — Ambulatory Visit (HOSPITAL_BASED_OUTPATIENT_CLINIC_OR_DEPARTMENT_OTHER): Payer: MEDICARE | Admitting: Oncology

## 2013-04-17 VITALS — BP 161/67 | HR 72 | Temp 97.5°F | Resp 22 | Ht 73.0 in | Wt 245.8 lb

## 2013-04-17 DIAGNOSIS — J449 Chronic obstructive pulmonary disease, unspecified: Secondary | ICD-10-CM

## 2013-04-17 DIAGNOSIS — C342 Malignant neoplasm of middle lobe, bronchus or lung: Secondary | ICD-10-CM

## 2013-04-19 NOTE — Progress Notes (Signed)
Hematology and Oncology Follow Up Visit  Jose Gordon [id number] 1928/12/04 79 y.o. 04/19/2013 6:12 PM   Principle Diagnosis: Encounter Diagnoses  Name Primary?  Marland Kitchen COPD (chronic obstructive pulmonary disease)   . Lung cancer, middle lobe Yes     Interim History:   Short interim followup visit for this 79 year old man and his wife to discuss results of recent needle biopsy of a new right middle lobe lung lesion. 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 Currently lesion strongly PET avid with uptake 27.4.  Minimal uptake in non pathologically enlarged mediastinal lymph nodes of uncertain significance with maximum uptake 4.6. He is now 79 years old. He has progressive obstructive airway disease. He had complications with his last lung surgery with development of an empyema. I do not think he is an acceptable candidate for another thoracotomy procedure.  Pathologist called me with final results on recent needle biopsy done on July 24. Although light microscopy features suggestive of a. neuroendocrine cancer, special stains are more consistent with a poorly differentiated squamous cell carcinoma. This makes sense compared with previous pathology from 2009 which was moderately differentiated squamous cell carcinoma in 2005 when histology was poorly differentiated squamous cell carcinoma.  Unfortunately he suffered a complication from the needle biopsy and developed significant hemoptysis and had to be admitted to the hospital and observed overnight. He is still coughing up small amounts of blood-tinged sputum.  Medications: reviewed  Allergies:  Allergies  Allergen Reactions  . Iohexol      Desc: remote allergy of rash 20 yrs ago. has had since with no pre-meds and no problems     Review of Systems:  ROS negative. Except as noted in history of present  illness.  Physical Exam: Blood pressure 161/67, pulse 72, temperature 97.5 F (36.4 C), temperature source Oral, resp. rate 22, height 6\' 1"  (1.854 m), weight 245 lb 12.8 oz (111.494 kg), SpO2 96.00%. Wt Readings from Last 3 Encounters:  04/17/13 245 lb 12.8 oz (111.494 kg)  04/10/13 246 lb 14.6 oz (112 kg)  03/27/13 247 lb 6.4 oz (112.22 kg)     General appearance: Well-nourished Caucasian man HENNT: Pharynx no erythema or exudate Lymph nodes: No lymphadenopathy Breasts: Lungs: Diffuse decreased breath sounds. Clear to auscultation and resonant to percussion throughout Heart: Regular rhythm no murmur Abdomen: Soft nontender Extremities: No edema Musculoskeletal: GU: Vascular: No cyanosis Neurologic: grossly normal Skin:  Lab Results: Lab Results  Component Value Date   WBC 10.6* 04/10/2013   HGB 12.9* 04/10/2013   HCT 36.7* 04/10/2013   MCV 83.4 04/10/2013   PLT 187 04/10/2013     Chemistry      Component Value Date/Time   NA 128* 04/10/2013 0330   NA 136 03/25/2012 0929   K 4.8 04/10/2013 0330   K 5.4* 03/25/2012 0929   CL 92* 04/10/2013 0330   CL 96* 03/25/2012 0929   CO2 29 04/10/2013 0330   CO2 31 03/25/2012 0929   BUN 14 04/10/2013 0330   BUN 10 03/25/2012 0929   CREATININE 0.90 04/10/2013 0330   CREATININE 1.0 03/25/2012 0929      Component Value Date/Time   CALCIUM 9.0 04/10/2013 0330   CALCIUM 9.0 03/25/2012 0929   ALKPHOS 51 03/25/2012 0929   ALKPHOS 58 03/27/2011 0752   AST 21 03/25/2012 0929   AST 22 03/27/2011 0752   ALT 20 03/25/2012 0929  ALT 14 03/27/2011 0752   BILITOT 1.20 03/25/2012 0929   BILITOT 0.7 03/27/2011 4010       Radiological Studies: Nm Pet Image Restag (ps) Skull Base To Thigh  04/07/2013   *RADIOLOGY REPORT*  Clinical Data: Initial treatment strategy for new primary lung cancer. Prior history of metachronous right lung cancers status post resection.  NUCLEAR MEDICINE PET SKULL BASE TO THIGH  Fasting Blood Glucose:  105  Technique:  17.8 mCi F-18 FDG was  injected intravenously. CT data was obtained and used for attenuation correction and anatomic localization only.  (This was not acquired as a diagnostic CT examination.) Additional exam technical data entered on technologist worksheet.  Comparison:  CT chest dated 03/18/2013  Findings:  Neck: No hypermetabolic lymph nodes in the neck.  Chest:  Status post right upper lobectomy and medial right lower lobe wedge resection.  4.2 x 3.4 cm mass in the right middle lobe (series 2/image 82), max SUV 27.4, compatible with primary bronchogenic neoplasm.  Small mediastinal lymph nodes, similar to prior studies but mildly FDG-avid above blood pool, including: --7 mm short-axis prevascular node (series 2/image 79), max SUV 3.0 --12 mm short-axis precarinal node (series 2/image 80), max SUV 4.6 --12 mm short-axis subcarinal node (series 2/image 95), max SUV 4.2  Coronary atherosclerosis.  Atherosclerotic calcifications of the aortic arch.  Abdomen/Pelvis:  No abnormal hypermetabolic activity within the liver, pancreas, adrenal glands, or spleen.  No hypermetabolic lymph nodes in the abdomen or pelvis.  Atherosclerotic calcifications of the abdominal aorta and branch vessels.  Skeleton:  No focal hypermetabolic activity to suggest skeletal metastasis.  Postsurgical changes related to prior lumbar fixation.  IMPRESSION: Status post right upper lobectomy and medial right lower lobe wedge resection.  4.2 cm right middle lobe mass, max SUV 27.4, compatible with primary bronchogenic neoplasm.  Small mediastinal measuring up to 12 mm short axis, mildly FDG-avid but similar to prior studies, nodal metastases not excluded.  Otherwise, no evidence of metastatic disease.   Original Report Authenticated By: Charline Bills, M.D.   Ct Biopsy  04/09/2013   *RADIOLOGY REPORT*  Clinical data:  Previous lung carcinoma x2.  New hypermetabolic right middle lobe mass.  CT-GUIDED LUNG CORE BIOPSY  Comparison:  PET CT 04/07/2013 and earlier  studies  Technique and findings: The procedure, risks (including but not limited to bleeding, infection, organ damage, pneumothorax, chest tube), benefits, and alternatives were explained to the patient. Questions regarding the procedure were encouraged and answered. The patient understands and consents to the procedure.Select axial scans through the thorax were obtained and the right middle lobe lesion was localized.  An appropriate skin entry site was determined. Operator donned sterile gloves and mask.   Site was marked, prepped with Betadine, draped in usual sterile fashion, infiltrated locally with 1% lidocaine.  Intravenous Fentanyl and Versed were administered as conscious sedation during continuous cardiorespiratory monitoring by the radiology RN, with a total moderate sedation time of 45 minutes.  Under CT fluoroscopic guidance, a 17 gauge trocar needle was advanced to the margin of the lesion.  Once needle tip position was confirmed, coaxial 18-gauge core biopsy samples were obtained, submitted in formalin to  surgical pathology.  The guide needle was removed.  Postprocedure scans show regional alveolar hemorrhage. No pneumothorax.  The patient experienced self-limited hemoptysis with transit and decrease in O2 saturations which returned to near normal with supplemental oxygenation.  He is admitted for overnight observation. Follow-up chest radiography in 1 hour is scheduled.  IMPRESSION: Technically  successful CT-guided core biopsy of right middle lobe lung mass.   Original Report Authenticated By: D. Andria Rhein, MD   Dg Chest Port 1 View  04/10/2013   *RADIOLOGY REPORT*  Clinical Data: Hemoptysis.  Post needle biopsy.  PORTABLE CHEST - 1 VIEW  Comparison: 04/09/2013  Findings: Postsurgical changes on the right.  Patchy airspace disease throughout the right lung and in the left lung base, stable.  No pneumothorax.  Heart is normal size.  No visible significant effusion.  IMPRESSION: No significant  change.  No pneumothorax.   Original Report Authenticated By: Charlett Nose, M.D.   Dg Chest Port 1 View  04/09/2013   *RADIOLOGY REPORT*  Clinical Data: Hemoptysis post biopsy  PORTABLE CHEST - 1 VIEW  Comparison: CT chest of 72,014 and chest x-ray of 02/15/2009  Findings: Post right lung biopsy, no pneumothorax is seen.  Opacity in the right mid upper lung field again is noted as demonstrated by recent CT.  No pleural effusion is seen.  The left lung is clear other than mild basilar atelectasis.  Heart size is stable. Resection in the posterior right fifth rib is noted.  IMPRESSION: No pneumothorax post right lung biopsy.   Original Report Authenticated By: Dwyane Dee, M.D.    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 lymph node status uncertain. See discussion above.  I discussed the staging and treatment of non-small cell lung cancer with the patient and his wife and a true diagrams for them to help them understand it. At best he has another stage IB lesion. At worst he has a IIIB lesion.   Plan: I have made a radiation oncology referral. I discussed possibility of adding chemotherapy to radiation but I on a setting radiation alone would be the best approach in this individual. I am scheduling pulmonary function tests with a DLCO. To assess how severe his obstructive airway disease is at this point.   CC:. Dr. Guerry Bruin; Dr. Antony Blackbird; Dr. Alycia Patten, MD 8/3/20146:12 PM

## 2013-04-21 ENCOUNTER — Telehealth: Payer: Self-pay | Admitting: Oncology

## 2013-04-21 NOTE — Telephone Encounter (Signed)
lvm for pt pt regarding to Sept appt...amiled pt appt sched and avs with letter

## 2013-04-24 ENCOUNTER — Telehealth: Payer: Self-pay | Admitting: Oncology

## 2013-04-24 NOTE — Telephone Encounter (Signed)
, °

## 2013-04-27 ENCOUNTER — Ambulatory Visit
Admission: RE | Admit: 2013-04-27 | Discharge: 2013-04-27 | Disposition: A | Discharge status: Home / Self Care | Payer: MEDICARE | Source: Ambulatory Visit | Attending: Radiation Oncology | Admitting: Radiation Oncology

## 2013-04-27 ENCOUNTER — Encounter: Payer: Self-pay | Admitting: Radiation Oncology

## 2013-04-27 VITALS — BP 153/69 | HR 66 | Temp 97.5°F | Resp 20 | Ht 73.0 in | Wt 246.0 lb

## 2013-04-27 DIAGNOSIS — G8929 Other chronic pain: Secondary | ICD-10-CM | POA: Insufficient documentation

## 2013-04-27 DIAGNOSIS — J449 Chronic obstructive pulmonary disease, unspecified: Secondary | ICD-10-CM | POA: Insufficient documentation

## 2013-04-27 DIAGNOSIS — C342 Malignant neoplasm of middle lobe, bronchus or lung: Secondary | ICD-10-CM

## 2013-04-27 DIAGNOSIS — Z87891 Personal history of nicotine dependence: Secondary | ICD-10-CM | POA: Insufficient documentation

## 2013-04-27 DIAGNOSIS — E119 Type 2 diabetes mellitus without complications: Secondary | ICD-10-CM | POA: Insufficient documentation

## 2013-04-27 DIAGNOSIS — I10 Essential (primary) hypertension: Secondary | ICD-10-CM | POA: Insufficient documentation

## 2013-04-27 DIAGNOSIS — Z79899 Other long term (current) drug therapy: Secondary | ICD-10-CM | POA: Insufficient documentation

## 2013-04-27 DIAGNOSIS — J4489 Other specified chronic obstructive pulmonary disease: Secondary | ICD-10-CM | POA: Insufficient documentation

## 2013-04-27 DIAGNOSIS — C3490 Malignant neoplasm of unspecified part of unspecified bronchus or lung: Secondary | ICD-10-CM | POA: Insufficient documentation

## 2013-04-27 DIAGNOSIS — M1710 Unilateral primary osteoarthritis, unspecified knee: Secondary | ICD-10-CM | POA: Insufficient documentation

## 2013-04-27 NOTE — Progress Notes (Signed)
Thoracic Location of Tumor / Histology: RML lung   Patient presented 1.5 months ago with symptoms of: cough w/small flecks of blood, dyspnea on mild exertion   Biopsies of (if applicable) revealed:  Diagnosis Lung, needle/core biopsy(ies), CT RML - POSITIVE FOR POORLY DIFFERENTIATED CARCINOMA, SEE COMMENT.  Tobacco/Marijuana/Snuff/ETOH use: 1 PPD 40+ years, quit 2000  Past/Anticipated interventions by cardiothoracic surgery, if any: none for this lesion, 2005 right LL lesion resected, 2009 right upper lobe lesion resected  Past/Anticipated interventions by medical oncology, if any: none in past, possible low dose chemo   Signs/Symptoms  Weight changes, if any: no  Respiratory complaints, if any: dyspnea on very mild exertion  Hemoptysis, if any: none at this time  Pain issues, if any: no  SAFETY ISSUES:  Prior radiation? no  Pacemaker/ICD? no  Possible current pregnancy? na  Is the patient on methotrexate? No  Current Complaints / other details: Retired from [employer], married. Pt denies pain, has some loss of appetite, fatigue, prod cough w/clear sputum, SOB w/very minimal activity. Pt denies hemoptysis.

## 2013-04-27 NOTE — Progress Notes (Signed)
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 male who is seen out courtesy of Dr. Cyndie Chime for an opinion concerning radiation therapy as part of management of the patient's recently  diagnosed squamous cell carcinoma of the right lung. Patient has a prior history of 2 metachronous non-small cell lung cancers surgically removed by Dr. Edwyna Shell.  He did not require any adjuvant treatment for these lesions. The patient presented recently with cough. Chest x-ray was concerning for pneumonia however antibiotic therapy did not clear this area and chest CT scan revealed a probable third primary.  He proceeded to undergo PET scan which showed activity in the right middle lobe mass as well as questionable mediastinal activity.. A biopsy of this mass was performed which revealed poorly differentiated carcinoma most consistent with squamous cell carcinoma. This is similar histology to his 2 prior lung cancers. In light of patient's age and medical issues in particular his respiratory status he was not felt to be a candidate for surgical intervention. In addition medical oncology do not feel he could tolerate combined modality therapy. Patient is now seen for consideration for definitive treatment for stage T2a versus stage IIIa non-small cell lung cancer. I should mention that the patient is not a good candidate for pathologic staging of his mediastinal lymph nodes given his respiratory status.Marland Kitchen  PREVIOUS RADIATION THERAPY: No  PAST MEDICAL HISTORY:  has a past medical history of COPD (chronic obstructive pulmonary disease)  (04/01/2012); HTN (hypertension), benign (04/01/2012); Lung cancer, upper lobe (04/01/2012); Lung cancer, lower lobe (04/01/2012); Lung cancer, middle lobe (03/27/2013); and DM type 2 (diabetes mellitus, type 2) (04/01/2012).    PAST SURGICAL HISTORY: Past Surgical History  Procedure Laterality Date  . Prostate surgery      40 yrs ago, no cancer  . Cataract surgery Bilateral   . Knee surgery Left     arthroscopic  . Nephrectomy      pt denies  . Lung surgery Right 2005, 2009    RLL '05, RUL 2009    FAMILY HISTORY: family history includes Cancer in his brother and sister.  SOCIAL HISTORY:  reports that he quit smoking about 14 years ago. His smoking use included Cigarettes. He has a 40 pack-year smoking history. He does not have any smokeless tobacco history on file. He reports that he does not drink alcohol or use illicit drugs.  ALLERGIES: Iohexol  MEDICATIONS:  Current Outpatient Prescriptions  Medication Sig Dispense Refill  . ALPRAZolam (XANAX) 0.5 MG tablet Take 0.5 mg by mouth daily.      . calcium-vitamin D (OSCAL WITH D) 500-200 MG-UNIT per tablet Take 1 tablet by mouth 2 (two) times daily.      . finasteride (PROSCAR) 5 MG tablet Take 5 mg by mouth daily.      Marland Kitchen GLUCOSAMINE-CHONDROITIN PO Take 1 tablet by mouth 2 (two) times daily. 1500/1200 mg      . Lido-Capsaicin-Men-Methyl Sal (MEDI-PATCH-LIDOCAINE) 0.5-0.035-5-20 % PTCH Apply 1 patch topically daily.      Marland Kitchen losartan (COZAAR) 50 MG tablet Take 50 mg by mouth daily.      . metoprolol tartrate (LOPRESSOR) 25  MG tablet Take 25 mg by mouth 2 (two) times daily.      . mirtazapine (REMERON) 7.5 MG tablet Take 7.5 mg by mouth daily.      . montelukast (SINGULAIR) 10 MG tablet Take 10 mg by mouth daily.      . Multiple Vitamins-Minerals (ICAPS MV PO) Take 2 capsules by mouth 2 (two) times daily. With breakfast & supper      . omeprazole (PRILOSEC) 20 MG capsule Take 20 mg by mouth daily.      . pregabalin (LYRICA) 75 MG capsule  Take 75 mg by mouth 2 (two) times daily.      Marland Kitchen Umeclidinium-Vilanterol (ANORO ELLIPTA) 62.5-25 MCG/INH AEPB Inhale into the lungs daily.      . budesonide-formoterol (SYMBICORT) 160-4.5 MCG/ACT inhaler Inhale 2 puffs into the lungs 2 (two) times daily.       No current facility-administered medications for this encounter.    REVIEW OF SYSTEMS:  A 15 point review of systems is documented in the electronic medical record. This was obtained by the nursing staff. However, I reviewed this with the patient to discuss relevant findings and make appropriate changes.  This does have a dry cough. He denies any mouth cyst at this time. He denies any pain in the chest area. Patient has chronic pain in his knees from osteoarthritis. He denies any visual problems headache or double vision. Patient's appetite is good.   PHYSICAL EXAM:  height is 6\' 1"  (1.854 m) and weight is 246 lb (111.585 kg). His oral temperature is 97.5 F (36.4 C). His blood pressure is 153/69 and his pulse is 66. His respiration is 20 and oxygen saturation is 95%.   BP 153/69  Pulse 66  Temp(Src) 97.5 F (36.4 C) (Oral)  Resp 20  Ht 6\' 1"  (1.854 m)  Wt 246 lb (111.585 kg)  BMI 32.46 kg/m2  SpO2 95%  General Appearance:    Alert, cooperative, no distress, appears stated age, somewhat hard of hearing, accompanied by his wife on evaluation today   Head:    Normocephalic, without obvious abnormality, atraumatic  Eyes:    PERRL, conjunctiva/corneas clear, EOM's intact             Nose:   Nares normal, septum midline, mucosa normal, no drainage    or sinus tenderness  Throat:   Lips, mucosa, and tongue normal; dentures in place ,gums normal  Neck:   Supple, symmetrical, trachea midline, no adenopathy;       thyroid:  No enlargement/tenderness/nodules; no carotid   bruit or JVD  Back:     Symmetric, no curvature, ROM normal, no CVA tenderness  Lungs:     Clear to auscultation bilaterally, respirations unlabored  Chest wall:    No  tenderness or deformity, 2 scars along the right posterior chest from his prior lung cancer surgeries  Heart:    Regular rate and rhythm,     Abdomen:     Soft, non-tender, bowel sounds active all four quadrants,    no masses, no organomegaly        Extremities:   Extremities normal, atraumatic, no cyanosis or edema  Pulses:   2+ and symmetric all extremities  Skin:   Skin color, texture, turgor normal, no rashes or lesions  Lymph nodes:   Cervical, supraclavicular, and axillary nodes normal  Neurologic:   CNII-XII intact. Normal strength, sensation and reflexes      throughout    KPS = 60  100 -  Normal; no complaints; no evidence of disease. 90   - Able to carry on normal activity; minor signs or symptoms of disease. 80   - Normal activity with effort; some signs or symptoms of disease. 65   - Cares for self; unable to carry on normal activity or to do active work. 60   - Requires occasional assistance, but is able to care for most of his personal needs. 50   - Requires considerable assistance and frequent medical care. 40   - Disabled; requires special care and assistance. 30   - Severely disabled; hospital admission is indicated although death not imminent. 20   - Very sick; hospital admission necessary; active supportive treatment necessary. 10   - Moribund; fatal processes progressing rapidly. 0     - Dead  Karnofsky DA, Abelmann WH, Craver LS and [facility] [phone]) The use of the nitrogen mustards in the palliative treatment of carcinoma: with particular reference to bronchogenic carcinoma Cancer 1 [id number]  LABORATORY DATA:  Lab Results  Component Value Date   WBC 10.6* 04/10/2013   HGB 12.9* 04/10/2013   HCT 36.7* 04/10/2013   MCV 83.4 04/10/2013   PLT 187 04/10/2013   Lab Results  Component Value Date   NA 128* 04/10/2013   K 4.8 04/10/2013   CL 92* 04/10/2013   CO2 29 04/10/2013   Lab Results  Component Value Date   ALT 20 03/25/2012   AST 21 03/25/2012   ALKPHOS 51  03/25/2012   BILITOT 1.20 03/25/2012     RADIOGRAPHY: Nm Pet Image Restag (ps) Skull Base To Thigh  04/07/2013   *RADIOLOGY REPORT*  Clinical Data: Initial treatment strategy for new primary lung cancer. Prior history of metachronous right lung cancers status post resection.  NUCLEAR MEDICINE PET SKULL BASE TO THIGH  Fasting Blood Glucose:  105  Technique:  17.8 mCi F-18 FDG was injected intravenously. CT data was obtained and used for attenuation correction and anatomic localization only.  (This was not acquired as a diagnostic CT examination.) Additional exam technical data entered on technologist worksheet.  Comparison:  CT chest dated 03/18/2013  Findings:  Neck: No hypermetabolic lymph nodes in the neck.  Chest:  Status post right upper lobectomy and medial right lower lobe wedge resection.  4.2 x 3.4 cm mass in the right middle lobe (series 2/image 82), max SUV 27.4, compatible with primary bronchogenic neoplasm.  Small mediastinal lymph nodes, similar to prior studies but mildly FDG-avid above blood pool, including: --7 mm short-axis prevascular node (series 2/image 79), max SUV 3.0 --12 mm short-axis precarinal node (series 2/image 80), max SUV 4.6 --12 mm short-axis subcarinal node (series 2/image 95), max SUV 4.2  Coronary atherosclerosis.  Atherosclerotic calcifications of the aortic arch.  Abdomen/Pelvis:  No abnormal hypermetabolic activity within the liver, pancreas, adrenal glands, or spleen.  No hypermetabolic lymph nodes in the abdomen or pelvis.  Atherosclerotic calcifications of the abdominal aorta and branch vessels.  Skeleton:  No focal hypermetabolic activity to suggest skeletal metastasis.  Postsurgical changes related to prior lumbar fixation.  IMPRESSION: Status post right upper lobectomy and medial right lower lobe wedge resection.  4.2 cm right middle lobe mass, max SUV 27.4, compatible with primary bronchogenic neoplasm.  Small mediastinal measuring up to 12 mm short axis, mildly  FDG-avid but similar to prior studies, nodal metastases not excluded.  Otherwise, no evidence of metastatic disease.   Original Report Authenticated By: Charline Bills, M.D.   Ct Biopsy  04/09/2013   *  RADIOLOGY REPORT*  Clinical data:  Previous lung carcinoma x2.  New hypermetabolic right middle lobe mass.  CT-GUIDED LUNG CORE BIOPSY  Comparison:  PET CT 04/07/2013 and earlier studies  Technique and findings: The procedure, risks (including but not limited to bleeding, infection, organ damage, pneumothorax, chest tube), benefits, and alternatives were explained to the patient. Questions regarding the procedure were encouraged and answered. The patient understands and consents to the procedure.Select axial scans through the thorax were obtained and the right middle lobe lesion was localized.  An appropriate skin entry site was determined. Operator donned sterile gloves and mask.   Site was marked, prepped with Betadine, draped in usual sterile fashion, infiltrated locally with 1% lidocaine.  Intravenous Fentanyl and Versed were administered as conscious sedation during continuous cardiorespiratory monitoring by the radiology RN, with a total moderate sedation time of 45 minutes.  Under CT fluoroscopic guidance, a 17 gauge trocar needle was advanced to the margin of the lesion.  Once needle tip position was confirmed, coaxial 18-gauge core biopsy samples were obtained, submitted in formalin to  surgical pathology.  The guide needle was removed.  Postprocedure scans show regional alveolar hemorrhage. No pneumothorax.  The patient experienced self-limited hemoptysis with transit and decrease in O2 saturations which returned to near normal with supplemental oxygenation.  He is admitted for overnight observation. Follow-up chest radiography in 1 hour is scheduled.  IMPRESSION: Technically successful CT-guided core biopsy of right middle lobe lung mass.   Original Report Authenticated By: D. Andria Rhein, MD   Dg  Chest Port 1 View  04/10/2013   *RADIOLOGY REPORT*  Clinical Data: Hemoptysis.  Post needle biopsy.  PORTABLE CHEST - 1 VIEW  Comparison: 04/09/2013  Findings: Postsurgical changes on the right.  Patchy airspace disease throughout the right lung and in the left lung base, stable.  No pneumothorax.  Heart is normal size.  No visible significant effusion.  IMPRESSION: No significant change.  No pneumothorax.   Original Report Authenticated By: Charlett Nose, M.D.   Dg Chest Port 1 View  04/09/2013   *RADIOLOGY REPORT*  Clinical Data: Hemoptysis post biopsy  PORTABLE CHEST - 1 VIEW  Comparison: CT chest of 72,014 and chest x-ray of 02/15/2009  Findings: Post right lung biopsy, no pneumothorax is seen.  Opacity in the right mid upper lung field again is noted as demonstrated by recent CT.  No pleural effusion is seen.  The left lung is clear other than mild basilar atelectasis.  Heart size is stable. Resection in the posterior right fifth rib is noted.  IMPRESSION: No pneumothorax post right lung biopsy.   Original Report Authenticated By: Dwyane Dee, M.D.      IMPRESSION: Squamous cell carcinoma of the right lung, stage T2a versus stage IIIa.  Patient will be a good candidate for a definitive course of radiation therapy for management of his non-small cell lung cancer. The patient and his wife do understand that his breathing could potentially worsen after his radiation therapy requiring supplemental oxygen. Since patient does have any other options he is willing to accept this risk and to proceed with definitive radiation therapy.  PLAN: Simulation and planning tomorrow at 8 AM. I anticipate the patient starting his therapy August 18 or 19th. Anticipate approximately 6 and half weeks of radiation therapy  I spent 60 minutes minutes face to face with the patient and more than 50% of that time was spent in counseling and/or coordination of care.    ------------------------------------------------  -----------------------------------  Fayrene Fearing  Carley Hammed, PhD, MD

## 2013-04-27 NOTE — Addendum Note (Signed)
Encounter addended by: Glennie Hawk, RN on: 04/27/2013  1:42 PM<BR>     Documentation filed: Charges VN

## 2013-04-27 NOTE — Progress Notes (Signed)
Please see the Nurse Progress Note in the MD Initial Consult Encounter for this patient. 

## 2013-04-28 ENCOUNTER — Ambulatory Visit
Admission: RE | Admit: 2013-04-28 | Discharge: 2013-04-28 | Disposition: A | Discharge status: Home / Self Care | Payer: MEDICARE | Source: Ambulatory Visit | Attending: Radiation Oncology | Admitting: Radiation Oncology

## 2013-04-28 DIAGNOSIS — R05 Cough: Secondary | ICD-10-CM | POA: Insufficient documentation

## 2013-04-28 DIAGNOSIS — Z510 Encounter for antineoplastic radiation therapy: Secondary | ICD-10-CM | POA: Insufficient documentation

## 2013-04-28 DIAGNOSIS — L538 Other specified erythematous conditions: Secondary | ICD-10-CM | POA: Insufficient documentation

## 2013-04-28 DIAGNOSIS — R059 Cough, unspecified: Secondary | ICD-10-CM | POA: Insufficient documentation

## 2013-04-28 DIAGNOSIS — C342 Malignant neoplasm of middle lobe, bronchus or lung: Secondary | ICD-10-CM | POA: Insufficient documentation

## 2013-04-29 ENCOUNTER — Ambulatory Visit (HOSPITAL_COMMUNITY)
Admission: RE | Admit: 2013-04-29 | Discharge: 2013-04-29 | Disposition: A | Discharge status: Home / Self Care | Payer: MEDICARE | Source: Ambulatory Visit | Attending: Oncology | Admitting: Oncology

## 2013-04-29 ENCOUNTER — Telehealth: Payer: Self-pay | Admitting: *Deleted

## 2013-04-29 DIAGNOSIS — C342 Malignant neoplasm of middle lobe, bronchus or lung: Secondary | ICD-10-CM

## 2013-04-29 DIAGNOSIS — J4489 Other specified chronic obstructive pulmonary disease: Secondary | ICD-10-CM | POA: Insufficient documentation

## 2013-04-29 DIAGNOSIS — J449 Chronic obstructive pulmonary disease, unspecified: Secondary | ICD-10-CM | POA: Insufficient documentation

## 2013-04-29 MED ORDER — ALBUTEROL SULFATE (5 MG/ML) 0.5% IN NEBU
2.5000 mg | INHALATION_SOLUTION | Freq: Once | RESPIRATORY_TRACT | Status: AC
  Administered 2013-04-29: 2.5 mg via RESPIRATORY_TRACT

## 2013-04-29 NOTE — Telephone Encounter (Signed)
Called patient to inform of test for 05-01-13, spoke with patient's wife and they are aware of this test, plus the patient's wife  confirmed that patient isn't allergic to contrast.

## 2013-05-01 ENCOUNTER — Ambulatory Visit (HOSPITAL_COMMUNITY)
Admission: RE | Admit: 2013-05-01 | Discharge: 2013-05-01 | Disposition: A | Discharge status: Home / Self Care | Payer: MEDICARE | Source: Ambulatory Visit | Attending: Radiation Oncology | Admitting: Radiation Oncology

## 2013-05-01 ENCOUNTER — Encounter (HOSPITAL_COMMUNITY): Payer: Self-pay

## 2013-05-01 DIAGNOSIS — I2510 Atherosclerotic heart disease of native coronary artery without angina pectoris: Secondary | ICD-10-CM | POA: Insufficient documentation

## 2013-05-01 DIAGNOSIS — R222 Localized swelling, mass and lump, trunk: Secondary | ICD-10-CM | POA: Insufficient documentation

## 2013-05-01 DIAGNOSIS — I700 Atherosclerosis of aorta: Secondary | ICD-10-CM | POA: Insufficient documentation

## 2013-05-01 DIAGNOSIS — C342 Malignant neoplasm of middle lobe, bronchus or lung: Secondary | ICD-10-CM | POA: Insufficient documentation

## 2013-05-01 MED ORDER — IOHEXOL 300 MG/ML  SOLN
100.0000 mL | Freq: Once | INTRAMUSCULAR | Status: AC | PRN
  Administered 2013-05-01: 80 mL via INTRAVENOUS

## 2013-05-04 ENCOUNTER — Other Ambulatory Visit: Payer: Self-pay | Admitting: Radiation Oncology

## 2013-05-04 ENCOUNTER — Telehealth: Payer: Self-pay | Admitting: *Deleted

## 2013-05-04 DIAGNOSIS — C342 Malignant neoplasm of middle lobe, bronchus or lung: Secondary | ICD-10-CM

## 2013-05-04 NOTE — Telephone Encounter (Signed)
Jose Gordon,wife of Jose Gordon called asking if his CT scan done Friday 05/01/13 results were back yet that MD or Colen Darling, RN  Could give her that information, MD is with a patient and [name] is at lunch, will inofrm her of your request and can call patient or wife back at [phone] 2:26 PM

## 2013-05-05 ENCOUNTER — Ambulatory Visit: Payer: MEDICARE | Admitting: Radiation Oncology

## 2013-05-06 ENCOUNTER — Encounter (HOSPITAL_COMMUNITY): Payer: Self-pay | Admitting: Pharmacy Technician

## 2013-05-06 ENCOUNTER — Ambulatory Visit: Payer: MEDICARE

## 2013-05-06 ENCOUNTER — Telehealth: Payer: Self-pay | Admitting: *Deleted

## 2013-05-06 NOTE — Telephone Encounter (Signed)
Received vm from pt's wife, Clyda Greener stating they had received vm, but pt unable to hear message well. Discussed w/Dr Roselind Messier who states pt to be scheduled in IR for procedure. Called Darcie and gave her IR phone number and instructed her to call today for pt's appointment. Ms Mcilhenny verbalized understanding.

## 2013-05-07 ENCOUNTER — Ambulatory Visit: Payer: MEDICARE

## 2013-05-07 ENCOUNTER — Other Ambulatory Visit: Payer: Self-pay | Admitting: Radiology

## 2013-05-08 ENCOUNTER — Ambulatory Visit (HOSPITAL_COMMUNITY)
Admission: RE | Admit: 2013-05-08 | Discharge: 2013-05-08 | Disposition: A | Discharge status: Home / Self Care | Payer: MEDICARE | Source: Ambulatory Visit | Attending: Radiation Oncology | Admitting: Radiation Oncology

## 2013-05-08 ENCOUNTER — Ambulatory Visit (HOSPITAL_COMMUNITY)
Admission: RE | Admit: 2013-05-08 | Discharge: 2013-05-08 | Disposition: A | Discharge status: Home / Self Care | Payer: MEDICARE | Source: Ambulatory Visit | Attending: Diagnostic Radiology | Admitting: Diagnostic Radiology

## 2013-05-08 ENCOUNTER — Telehealth: Payer: Self-pay | Admitting: *Deleted

## 2013-05-08 ENCOUNTER — Encounter (HOSPITAL_COMMUNITY): Payer: Self-pay

## 2013-05-08 ENCOUNTER — Ambulatory Visit: Payer: MEDICARE

## 2013-05-08 VITALS — BP 151/58 | HR 75 | Temp 97.5°F | Resp 16 | Ht 73.0 in | Wt 245.0 lb

## 2013-05-08 DIAGNOSIS — J4489 Other specified chronic obstructive pulmonary disease: Secondary | ICD-10-CM | POA: Insufficient documentation

## 2013-05-08 DIAGNOSIS — I10 Essential (primary) hypertension: Secondary | ICD-10-CM | POA: Insufficient documentation

## 2013-05-08 DIAGNOSIS — C342 Malignant neoplasm of middle lobe, bronchus or lung: Secondary | ICD-10-CM

## 2013-05-08 DIAGNOSIS — E119 Type 2 diabetes mellitus without complications: Secondary | ICD-10-CM | POA: Insufficient documentation

## 2013-05-08 DIAGNOSIS — R042 Hemoptysis: Secondary | ICD-10-CM | POA: Insufficient documentation

## 2013-05-08 DIAGNOSIS — C3490 Malignant neoplasm of unspecified part of unspecified bronchus or lung: Secondary | ICD-10-CM | POA: Insufficient documentation

## 2013-05-08 DIAGNOSIS — J449 Chronic obstructive pulmonary disease, unspecified: Secondary | ICD-10-CM | POA: Insufficient documentation

## 2013-05-08 DIAGNOSIS — Z79899 Other long term (current) drug therapy: Secondary | ICD-10-CM | POA: Insufficient documentation

## 2013-05-08 LAB — PROTIME-INR: INR: 1.03 (ref 0.00–1.49)

## 2013-05-08 LAB — CBC
HCT: 41.3 % (ref 39.0–52.0)
Hemoglobin: 14.4 g/dL (ref 13.0–17.0)
MCH: 29.3 pg (ref 26.0–34.0)
MCHC: 34.9 g/dL (ref 30.0–36.0)
RBC: 4.92 MIL/uL (ref 4.22–5.81)

## 2013-05-08 LAB — APTT: aPTT: 30 seconds (ref 24–37)

## 2013-05-08 MED ORDER — SODIUM CHLORIDE 0.9 % IV SOLN
INTRAVENOUS | Status: DC
  Administered 2013-05-08: 11:00:00 via INTRAVENOUS

## 2013-05-08 MED ORDER — FENTANYL CITRATE 0.05 MG/ML IJ SOLN
INTRAMUSCULAR | Status: AC | PRN
  Administered 2013-05-08: 50 ug via INTRAVENOUS

## 2013-05-08 MED ORDER — MIDAZOLAM HCL 2 MG/2ML IJ SOLN
INTRAMUSCULAR | Status: AC | PRN
  Administered 2013-05-08: 1 mg via INTRAVENOUS

## 2013-05-08 MED ORDER — MIDAZOLAM HCL 2 MG/2ML IJ SOLN
INTRAMUSCULAR | Status: AC
  Filled 2013-05-08: qty 4

## 2013-05-08 MED ORDER — FENTANYL CITRATE 0.05 MG/ML IJ SOLN
INTRAMUSCULAR | Status: AC
  Filled 2013-05-08: qty 4

## 2013-05-08 NOTE — Procedures (Signed)
CT guided aspiration of right pleural collection.  5 ml of blood was removed.  No immediate complication.

## 2013-05-08 NOTE — H&P (Signed)
Referring Physician: Dr. Roselind Messier HPI: Jose Gordon is an 79 y.o. male with PMHx of lung cancer s/p resection RUL,RLL. Patient recently had a CT guided RML biopsy on 04/09/13. Follow-up CT for radiation preparation on 05/01/13 revealed 2.6 x 6.7 cm lesion along the anterior chest wall, new, favored to reflect loculated pleural hemorrhage related to the prior biopsy. Tumor is not entirely excluded but is considered less likely given the rapid change. Patient states he has had slight hemoptysis 2 weeks post biopsy and denies any currently. He does still have chronic cough. Patient with PMHx of COPD. He denies any chest pain today. He denies any fever or chills. He denies any active bleeding.   Past Medical History:  Past Medical History  Diagnosis Date  . COPD (chronic obstructive pulmonary disease) 04/01/2012  . HTN (hypertension), benign 04/01/2012  . Lung cancer, upper lobe 04/01/2012    T2N0 IB resected Jan,2005  . Lung cancer, lower lobe 04/01/2012    T1aN0M0 RLL resected June,2009  . Lung cancer, middle lobe 03/27/2013    Right side  3.1 cm 03/18/13 CT  . DM type 2 (diabetes mellitus, type 2) 04/01/2012    no meds    Past Surgical History:  Past Surgical History  Procedure Laterality Date  . Prostate surgery      40 yrs ago, no cancer  . Cataract surgery Bilateral   . Knee surgery Left     arthroscopic  . Nephrectomy      pt denies  . Lung surgery Right 2005, 2009    RLL '05, RUL 2009    Family History:  Family History  Problem Relation Age of Onset  . Cancer Sister     lung  . Cancer Brother     lung    Social History:  reports that he quit smoking about 14 years ago. His smoking use included Cigarettes. He has a 40 pack-year smoking history. He does not have any smokeless tobacco history on file. He reports that he does not drink alcohol or use illicit drugs.  Allergies: No Active Allergies  Medications:   Medication List    ASK your doctor about these medications      albuterol (2.5 MG/3ML) 0.083% nebulizer solution  Commonly known as:  PROVENTIL  Take 2.5 mg by nebulization every 6 (six) hours as needed for wheezing.     ALPRAZolam 0.5 MG tablet  Commonly known as:  XANAX  Take 0.5 mg by mouth at bedtime.     ANORO ELLIPTA 62.5-25 MCG/INH Aepb  Generic drug:  Umeclidinium-Vilanterol  Inhale into the lungs daily.     calcium-vitamin D 500-200 MG-UNIT per tablet  Commonly known as:  OSCAL WITH D  Take 1 tablet by mouth 2 (two) times daily.     finasteride 5 MG tablet  Commonly known as:  PROSCAR  Take 5 mg by mouth daily.     GLUCOSAMINE-CHONDROITIN PO  Take 1 tablet by mouth 2 (two) times daily. 1500/1200 mg     ICAPS MV PO  Take 2 capsules by mouth 2 (two) times daily. With breakfast & supper     lidocaine 5 %  Commonly known as:  LIDODERM  Place 1 patch onto the skin daily as needed (for knee pain). Remove & Discard patch within 12 hours or as directed by MD,     metoprolol tartrate 25 MG tablet  Commonly known as:  LOPRESSOR  Take 25 mg by mouth 2 (two) times daily.  mirtazapine 7.5 MG tablet  Commonly known as:  REMERON  Take 7.5 mg by mouth daily.     montelukast 10 MG tablet  Commonly known as:  SINGULAIR  Take 10 mg by mouth daily.     naproxen sodium 220 MG tablet  Commonly known as:  ANAPROX  Take 220 mg by mouth 2 (two) times daily as needed (for pain).     omeprazole 20 MG capsule  Commonly known as:  PRILOSEC  Take 20 mg by mouth daily.     pregabalin 75 MG capsule  Commonly known as:  LYRICA  Take 75 mg by mouth 2 (two) times daily.        Please HPI for pertinent positives, otherwise complete 10 system ROS negative.  Physical Exam: BP 149/64  Pulse 62  Temp(Src) 97.9 F (36.6 C) (Oral)  Resp 20  Ht 6\' 1"  (1.854 m)  Wt 245 lb (111.131 kg)  BMI 32.33 kg/m2  SpO2 95% Body mass index is 32.33 kg/(m^2).   General Appearance:  Alert, cooperative, no distress, appears stated age  Head:   Normocephalic, without obvious abnormality, atraumatic  Lungs:   Diminished bilaterally, no w/r/r, respirations unlabored without use of accessory muscles.  Chest Wall:  No tenderness or deformity  Heart:  Regular rate and rhythm, S1, S2 normal, no murmur, rub or gallop.  Abdomen:   Soft, non-tender, non distended.  Extremities: Extremities normal, atraumatic, no cyanosis or edema  Pulses: 1+ and symmetric  Neurologic: Normal affect, no gross deficits.   Results for orders placed during the hospital encounter of 05/08/13 (from the past 48 hour(s))  PROTIME-INR     Status: None   Collection Time    05/08/13 11:00 AM      Result Value Range   Prothrombin Time 13.3  11.6 - 15.2 seconds   INR 1.03  0.00 - 1.49  APTT     Status: None   Collection Time    05/08/13 11:05 AM      Result Value Range   aPTT 30  24 - 37 seconds  CBC     Status: None   Collection Time    05/08/13 11:05 AM      Result Value Range   WBC 7.6  4.0 - 10.5 K/uL   RBC 4.92  4.22 - 5.81 MIL/uL   Hemoglobin 14.4  13.0 - 17.0 g/dL   HCT 04.5  40.9 - 81.1 %   MCV 83.9  78.0 - 100.0 fL   MCH 29.3  26.0 - 34.0 pg   MCHC 34.9  30.0 - 36.0 g/dL   RDW 91.4  78.2 - 95.6 %   Platelets 212  150 - 400 K/uL   No results found.  Assessment/Plan New right anterior chest wall lesion on CT 05/01/13 s/p RML biopsy 04/09/13. CT guided right anterior chest wall aspiration today. Labs reviewed, patient has been NPO. Risks and Benefits discussed with the patient and his wife. All of the patient's questions were answered, patient is agreeable to proceed. Consent signed and in chart.   Pattricia Boss D PA-C 05/08/2013, 12:19 PM

## 2013-05-08 NOTE — Telephone Encounter (Addendum)
Returned call from pt's wife inquiring about pt's treatment start time/date. Informed her that per pt's schedule in Epic he has treatment 05/11/13 at 9:30 am then put her through to linac 1, Victorino Dike RT for further verification. Wife states pt has done well since aspiration today.

## 2013-05-11 ENCOUNTER — Ambulatory Visit: Payer: MEDICARE

## 2013-05-11 ENCOUNTER — Telehealth: Payer: Self-pay | Admitting: *Deleted

## 2013-05-11 NOTE — Telephone Encounter (Signed)
Per Dr Roselind Messier notified linac 1 pt's treatment not to begin today. Pt will be resimulated prior to beginning radiation treatment. Dr Roselind Messier stated he will call pt and inform.

## 2013-05-12 ENCOUNTER — Ambulatory Visit: Payer: MEDICARE

## 2013-05-12 ENCOUNTER — Ambulatory Visit
Admission: RE | Admit: 2013-05-12 | Discharge: 2013-05-12 | Disposition: A | Discharge status: Home / Self Care | Payer: MEDICARE | Source: Ambulatory Visit | Attending: Radiation Oncology | Admitting: Radiation Oncology

## 2013-05-12 DIAGNOSIS — C342 Malignant neoplasm of middle lobe, bronchus or lung: Secondary | ICD-10-CM

## 2013-05-12 NOTE — Progress Notes (Signed)
  Radiation Oncology         [phone] ________________________________  Name: Jose Gordon MRN: [id number]  Date: 05/12/2013  DOB: [birth year]  SIMULATION AND TREATMENT PLANNING NOTE  DIAGNOSIS:  Squamous cell carcinoma of the right lung, stage T2a versus stage IIIa   NARRATIVE:  The patient was brought to the CT Simulation planning suite.  Identity was confirmed.  All relevant records and images related to the planned course of therapy were reviewed.  The patient freely provided informed written consent to proceed with treatment after reviewing the details related to the planned course of therapy. The consent form was witnessed and verified by the simulation staff.  Then, the patient was set-up in a stable reproducible  supine position for radiation therapy.  CT images were obtained.  Surface markings were placed.  The CT images were loaded into the planning software.  Then the target and avoidance structures were contoured.  Treatment planning then occurred.  The radiation prescription was entered and confirmed.  Then, I designed and supervised the construction of a total of 1 medically necessary complex treatment devices.  I have requested : 3D Simulation  I have requested a DVH of the following structures: GTV, PTV, lungs, heart, esophagus, spinal cord.  I have ordered:dose calc.  PLAN:  The patient will receive 63 Gy in 35 fractions. Please note that this is a rescan after the patient had aspiration of the hematoma in the right upper chest region which was noted on his original treatment planning CT scan.  This was felt to be a result of his initial biopsy to obtain diagnosis.  ________________________________  -----------------------------------  Billie Lade, PhD, MD

## 2013-05-13 ENCOUNTER — Ambulatory Visit: Payer: MEDICARE

## 2013-05-14 ENCOUNTER — Ambulatory Visit: Payer: MEDICARE

## 2013-05-15 ENCOUNTER — Ambulatory Visit
Admission: RE | Admit: 2013-05-15 | Discharge: 2013-05-15 | Disposition: A | Discharge status: Home / Self Care | Payer: MEDICARE | Source: Ambulatory Visit | Attending: Radiation Oncology | Admitting: Radiation Oncology

## 2013-05-15 ENCOUNTER — Ambulatory Visit: Payer: MEDICARE

## 2013-05-19 ENCOUNTER — Ambulatory Visit
Admission: RE | Admit: 2013-05-19 | Discharge: 2013-05-19 | Disposition: A | Discharge status: Home / Self Care | Payer: MEDICARE | Source: Ambulatory Visit | Attending: Radiation Oncology | Admitting: Radiation Oncology

## 2013-05-19 ENCOUNTER — Ambulatory Visit: Payer: MEDICARE

## 2013-05-19 DIAGNOSIS — C342 Malignant neoplasm of middle lobe, bronchus or lung: Secondary | ICD-10-CM

## 2013-05-19 MED ORDER — BIAFINE EX EMUL
Freq: Two times a day (BID) | CUTANEOUS | Status: DC
  Administered 2013-05-19: 11:00:00 via TOPICAL

## 2013-05-19 NOTE — Progress Notes (Signed)
Post sim ed completed w/pt and wife. Gave pt "Radiation and You" booklet w/all pertinent information marked and discussed, re: fatigue, skin irritation/care, throat irritation/care, nutrition, pain. Gave pt Biafine lotion w/inttructions for proper use. All questions answered. Wife and pt verbalized understanding. Will e mail nutritionist to make appointment for pt.

## 2013-05-20 ENCOUNTER — Ambulatory Visit
Admission: RE | Admit: 2013-05-20 | Discharge: 2013-05-20 | Disposition: A | Discharge status: Home / Self Care | Payer: MEDICARE | Source: Ambulatory Visit | Attending: Radiation Oncology | Admitting: Radiation Oncology

## 2013-05-20 ENCOUNTER — Ambulatory Visit: Payer: MEDICARE

## 2013-05-20 ENCOUNTER — Encounter: Payer: Self-pay | Admitting: Radiation Oncology

## 2013-05-20 VITALS — BP 145/68 | HR 65 | Temp 97.5°F | Resp 20 | Wt 248.1 lb

## 2013-05-20 DIAGNOSIS — C342 Malignant neoplasm of middle lobe, bronchus or lung: Secondary | ICD-10-CM

## 2013-05-20 NOTE — Progress Notes (Signed)
[facility]    Radiation Oncology [address] [place]     Maryln Gottron, M.D. [place], [place] [address]               Billie Lade, M.D., Ph.D. Phone: [phone]      Molli Hazard A. Kathrynn Running, M.D. Fax: [phone]      Radene Gunning, M.D., Ph.D.         Lurline Hare, M.D.         Grayland Jack, M.D Weekly Treatment Management Note  Name: Jose Gordon     MRN: [id number]        CSN: [id number] Date: 05/20/2013      DOB: [birth year]  CC: Jose Garbe, MD         [place]    Status: Outpatient  Diagnosis: The encounter diagnosis was Lung cancer, middle lobe.  Current Dose: 3.6 Gy  Current Fraction: 2  Planned Dose: 63 Gy  Narrative: Jose Gordon was seen today for weekly treatment management. The chart was checked and CBCT  were reviewed. He is tolerating his treatments well this time without any side effects.  Review of patient's allergies indicates no active allergies.  Current Outpatient Prescriptions  Medication Sig Dispense Refill  . albuterol (PROVENTIL) (2.5 MG/3ML) 0.083% nebulizer solution Take 2.5 mg by nebulization every 6 (six) hours as needed for wheezing.      Marland Kitchen ALPRAZolam (XANAX) 0.5 MG tablet Take 0.5 mg by mouth at bedtime.       . calcium-vitamin D (OSCAL WITH D) 500-200 MG-UNIT per tablet Take 1 tablet by mouth 2 (two) times daily.      Marland Kitchen emollient (BIAFINE) cream Apply topically 2 (two) times daily.      . finasteride (PROSCAR) 5 MG tablet Take 5 mg by mouth daily.      Marland Kitchen GLUCOSAMINE-CHONDROITIN PO Take 1 tablet by mouth 2 (two) times daily. 1500/1200 mg      . lidocaine (LIDODERM) 5 % Place 1 patch onto the skin daily as needed (for knee pain). Remove & Discard patch within 12 hours or as directed by MD,      . metoprolol tartrate (LOPRESSOR) 25 MG tablet Take 25 mg by mouth 2 (two) times daily.      . mirtazapine (REMERON) 7.5 MG tablet Take 7.5 mg by mouth daily.      . montelukast (SINGULAIR) 10 MG tablet Take 10 mg by  mouth daily.      . Multiple Vitamins-Minerals (ICAPS MV PO) Take 2 capsules by mouth 2 (two) times daily. With breakfast & supper      . naproxen sodium (ANAPROX) 220 MG tablet Take 220 mg by mouth 2 (two) times daily as needed (for pain).      Marland Kitchen omeprazole (PRILOSEC) 20 MG capsule Take 20 mg by mouth daily.      Marland Kitchen Umeclidinium-Vilanterol (ANORO ELLIPTA) 62.5-25 MCG/INH AEPB Inhale into the lungs daily.       No current facility-administered medications for this encounter.   Labs:  Lab Results  Component Value Date   WBC 7.6 05/08/2013   HGB 14.4 05/08/2013   HCT 41.3 05/08/2013   MCV 83.9 05/08/2013   PLT 212 05/08/2013   Lab Results  Component Value Date   CREATININE 0.90 04/10/2013   BUN 14 04/10/2013   NA 128* 04/10/2013   K 4.8 04/10/2013   CL 92* 04/10/2013   CO2 29 04/10/2013   Lab Results  Component Value Date  ALT 20 03/25/2012   AST 21 03/25/2012   BILITOT 1.20 03/25/2012    Physical Examination:  weight is 248 lb 1.6 oz (112.537 kg). His oral temperature is 97.5 F (36.4 C). His blood pressure is 145/68 and his pulse is 65. His respiration is 20 and oxygen saturation is 97%.    Wt Readings from Last 3 Encounters:  05/20/13 248 lb 1.6 oz (112.537 kg)  05/08/13 245 lb (111.131 kg)  04/27/13 246 lb (111.585 kg)     Lungs - Normal respiratory effort, chest expands symmetrically. Lungs are clear to auscultation, no crackles or wheezes.  Heart has regular rhythm and rate  Abdomen is soft and non tender with normal bowel sounds  Assessment:  Patient tolerating treatments well  Plan: Continue treatment per original radiation prescription

## 2013-05-20 NOTE — Progress Notes (Signed)
Pt denies pain. He reports prod cough w/clear sputum, SOB w/minmal activity and fatigue. Pt denies loss of appetite.

## 2013-05-21 ENCOUNTER — Ambulatory Visit: Payer: MEDICARE

## 2013-05-21 ENCOUNTER — Ambulatory Visit
Admission: RE | Admit: 2013-05-21 | Discharge: 2013-05-21 | Disposition: A | Discharge status: Home / Self Care | Payer: MEDICARE | Source: Ambulatory Visit | Attending: Radiation Oncology | Admitting: Radiation Oncology

## 2013-05-21 ENCOUNTER — Ambulatory Visit: Payer: MEDICARE | Admitting: Nutrition

## 2013-05-21 NOTE — Progress Notes (Signed)
This patient is an 79 year old male diagnosed with lung cancer.  He is a patient of Dr. Roselind Messier.  Past medical history includes COPD, hypertension, and diabetes.  Medications include Xanax, Os-Cal with D., glucosamine/chondroitin, Remeron, and multivitamin.  Labs were reviewed.  Height: 6 feet 1 inch. Weight: 248.1 pounds. Usual body weight 248 pounds. BMI: 32.74.  Patient and wife present to nutrition assessment.  Patient has just begun radiation therapy.  He currently denies nutrition side effects.  His weight is stable.  He has no difficulty chewing or swallowing at this time.  Patient drinks Ensure Plus occasionally, and has this product at home.  Nutrition diagnosis: Predicted suboptimal energy intake related to diagnosis of lung cancer and associated treatments as evidenced by history or presence of a condition for which research shows an increased incidence of suboptimal energy intake.  Intervention: Patient was educated on strategies for weight maintenance throughout treatment.  He was educated to consume small meals and soft, moist foods as needed for adequate calories and protein.  Patient was educated on protein sources.  Fact sheets were provided.  Coupons provided for Ensure Plus. Contact information provided.  Monitoring, evaluation, goals: Patient will tolerate adequate calories and protein for minimal weight loss throughout treatment.  Next visit: Patient will contact me with questions or concerns.  No followup needed at this time.

## 2013-05-22 ENCOUNTER — Ambulatory Visit: Payer: MEDICARE

## 2013-05-22 ENCOUNTER — Ambulatory Visit
Admission: RE | Admit: 2013-05-22 | Discharge: 2013-05-22 | Disposition: A | Discharge status: Home / Self Care | Payer: MEDICARE | Source: Ambulatory Visit | Attending: Radiation Oncology | Admitting: Radiation Oncology

## 2013-05-25 ENCOUNTER — Ambulatory Visit
Admission: RE | Admit: 2013-05-25 | Discharge: 2013-05-25 | Disposition: A | Discharge status: Home / Self Care | Payer: MEDICARE | Source: Ambulatory Visit | Attending: Radiation Oncology | Admitting: Radiation Oncology

## 2013-05-25 ENCOUNTER — Ambulatory Visit: Payer: MEDICARE

## 2013-05-26 ENCOUNTER — Ambulatory Visit
Admission: RE | Admit: 2013-05-26 | Discharge: 2013-05-26 | Disposition: A | Discharge status: Home / Self Care | Payer: MEDICARE | Source: Ambulatory Visit | Attending: Radiation Oncology | Admitting: Radiation Oncology

## 2013-05-26 ENCOUNTER — Other Ambulatory Visit (HOSPITAL_BASED_OUTPATIENT_CLINIC_OR_DEPARTMENT_OTHER): Payer: MEDICARE | Admitting: Lab

## 2013-05-26 ENCOUNTER — Ambulatory Visit (HOSPITAL_BASED_OUTPATIENT_CLINIC_OR_DEPARTMENT_OTHER): Payer: MEDICARE | Admitting: Nurse Practitioner

## 2013-05-26 ENCOUNTER — Ambulatory Visit: Payer: MEDICARE

## 2013-05-26 ENCOUNTER — Telehealth: Payer: Self-pay | Admitting: Oncology

## 2013-05-26 VITALS — BP 146/80 | HR 67 | Temp 97.7°F | Ht 73.0 in

## 2013-05-26 DIAGNOSIS — C3410 Malignant neoplasm of upper lobe, unspecified bronchus or lung: Secondary | ICD-10-CM

## 2013-05-26 DIAGNOSIS — I10 Essential (primary) hypertension: Secondary | ICD-10-CM

## 2013-05-26 DIAGNOSIS — C342 Malignant neoplasm of middle lobe, bronchus or lung: Secondary | ICD-10-CM

## 2013-05-26 DIAGNOSIS — E119 Type 2 diabetes mellitus without complications: Secondary | ICD-10-CM

## 2013-05-26 DIAGNOSIS — C3430 Malignant neoplasm of lower lobe, unspecified bronchus or lung: Secondary | ICD-10-CM

## 2013-05-26 DIAGNOSIS — J449 Chronic obstructive pulmonary disease, unspecified: Secondary | ICD-10-CM

## 2013-05-26 LAB — CBC WITH DIFFERENTIAL/PLATELET
Basophils Absolute: 0 10*3/uL (ref 0.0–0.1)
EOS%: 3.2 % (ref 0.0–7.0)
Eosinophils Absolute: 0.2 10*3/uL (ref 0.0–0.5)
HCT: 45 % (ref 38.4–49.9)
HGB: 15.4 g/dL (ref 13.0–17.1)
MCH: 29.2 pg (ref 27.2–33.4)
NEUT#: 5.2 10*3/uL (ref 1.5–6.5)
NEUT%: 71.1 % (ref 39.0–75.0)
RDW: 14 % (ref 11.0–14.6)
lymph#: 0.9 10*3/uL (ref 0.9–3.3)

## 2013-05-26 LAB — COMPREHENSIVE METABOLIC PANEL (CC13)
Albumin: 3.3 g/dL — ABNORMAL LOW (ref 3.5–5.0)
BUN: 13 mg/dL (ref 7.0–26.0)
CO2: 27 mEq/L (ref 22–29)
Calcium: 9.4 mg/dL (ref 8.4–10.4)
Chloride: 99 mEq/L (ref 98–109)
Creatinine: 1 mg/dL (ref 0.7–1.3)
Glucose: 99 mg/dl (ref 70–140)
Potassium: 4.4 mEq/L (ref 3.5–5.1)

## 2013-05-26 NOTE — Telephone Encounter (Signed)
Gave pt appt for ML and MD and Ct for December and October 2015, and Ct

## 2013-05-26 NOTE — Progress Notes (Signed)
[facility]    Radiation Oncology [address] [place]     Jose Gordon, M.D. [place], [place] [address]               Jose Gordon, M.D., Ph.D. Phone: [phone]      Molli Hazard A. Kathrynn Running, M.D. Fax: [phone]      Radene Gunning, M.D., Ph.D.         Lurline Hare, M.D.         Grayland Jack, M.D Weekly Treatment Management Note  Name: Jose Gordon     MRN: [id number]        CSN: [id number] Date: 05/26/2013      DOB: 1929/06/19  CC: Jose Garbe, MD         [place]    Status: Outpatient  Diagnosis: The encounter diagnosis was Lung cancer, middle lobe.  Current Dose: 10.8 Gy  Current Fraction: 6  Planned Dose: 63 Gy  Narrative: Jose Gordon was seen today for weekly treatment management. The chart was checked and CBCT  were reviewed. He is tolerating treatments well this time except for fatigue.  Review of patient's allergies indicates no known allergies.  Current Outpatient Prescriptions  Medication Sig Dispense Refill  . albuterol (PROVENTIL) (2.5 MG/3ML) 0.083% nebulizer solution Take 2.5 mg by nebulization every 6 (six) hours as needed for wheezing.      Marland Kitchen ALPRAZolam (XANAX) 0.5 MG tablet Take 0.5 mg by mouth at bedtime.       . calcium-vitamin D (OSCAL WITH D) 500-200 MG-UNIT per tablet Take 1 tablet by mouth 2 (two) times daily.      Marland Kitchen emollient (BIAFINE) cream Apply topically 2 (two) times daily.      . finasteride (PROSCAR) 5 MG tablet Take 5 mg by mouth daily.      Marland Kitchen GLUCOSAMINE-CHONDROITIN PO Take 1 tablet by mouth 2 (two) times daily. 1500/1200 mg      . lidocaine (LIDODERM) 5 % Place 1 patch onto the skin daily as needed (for knee pain). Remove & Discard patch within 12 hours or as directed by MD,      . metoprolol tartrate (LOPRESSOR) 25 MG tablet Take 25 mg by mouth 2 (two) times daily.      . mirtazapine (REMERON) 7.5 MG tablet Take 7.5 mg by mouth daily.      . montelukast (SINGULAIR) 10 MG tablet Take 10 mg by mouth  daily.      . Multiple Vitamins-Minerals (ICAPS MV PO) Take 2 capsules by mouth 2 (two) times daily. With breakfast & supper      . naproxen sodium (ANAPROX) 220 MG tablet Take 220 mg by mouth 2 (two) times daily as needed (for pain).      Marland Kitchen omeprazole (PRILOSEC) 20 MG capsule Take 20 mg by mouth daily.      Marland Kitchen Umeclidinium-Vilanterol (ANORO ELLIPTA) 62.5-25 MCG/INH AEPB Inhale into the lungs daily.       No current facility-administered medications for this encounter.   Labs:  Lab Results  Component Value Date   WBC 7.6 05/08/2013   HGB 14.4 05/08/2013   HCT 41.3 05/08/2013   MCV 83.9 05/08/2013   PLT 212 05/08/2013   Lab Results  Component Value Date   CREATININE 0.90 04/10/2013   BUN 14 04/10/2013   NA 128* 04/10/2013   K 4.8 04/10/2013   CL 92* 04/10/2013   CO2 29 04/10/2013   Lab Results  Component Value Date   ALT  20 03/25/2012   AST 21 03/25/2012   BILITOT 1.20 03/25/2012    Physical Examination:  height is 6\' 1"  (1.854 m). His temperature is 97.7 F (36.5 C). His blood pressure is 146/80 and his pulse is 67. His oxygen saturation is 97%.    Wt Readings from Last 3 Encounters:  05/20/13 248 lb 1.6 oz (112.537 kg)  05/08/13 245 lb (111.131 kg)  04/27/13 246 lb (111.585 kg)     Lungs - Normal respiratory effort, chest expands symmetrically. Lungs are clear to auscultation, no crackles or wheezes.  Heart has regular rhythm and rate  Abdomen is soft and non tender with normal bowel sounds  Assessment:  Patient tolerating treatments well  Plan: Continue treatment per original radiation prescription

## 2013-05-26 NOTE — Progress Notes (Signed)
Jose Gordon here in a wheel chair for weekly under treatment visit with his wife.  He has had 6 fractions to his right lung.  He denies pain.  He does have a cough that is dry.  He does occasionally bring up clear sputum with a yellow tinge.  He is short of breath with activity.  He needs to use a wheelchair to come to treatments because he can't walk all the way without becoming short of breath.  He denies a sore throat.  His skin is intact on his right chest and back.  He does have biafine cream to use if needed.  He does have fatigue.  He also reports that he is not steady on his feet.  He was unable to stand still to get his weight.

## 2013-05-26 NOTE — Progress Notes (Signed)
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 Most recently he was found to have a mass in the right middle lobe. PET scan showed significant uptake in the primary lung mass with minimal uptake in the mediastinum. 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 He developed significant hemoptysis following the biopsy procedure and was admitted overnight for observation.  Followup CT for radiation planning showed what was felt to be a loculated pleural hemorrhage related to the prior biopsy. He underwent CT-guided aspiration of the right pleural collection in interventional radiology on 05/08/2013 with 5 cc of fluid removed. No malignant cells were identified.  He began the course of radiation therapy on 05/19/2013.  He is seen today for scheduled followup. Baseline dyspnea on exertion is unchanged. He has had no further hemoptysis. Appetite is stable. No dysphagia or odynophagia. No nausea or vomiting. Bowels moving regularly. No urinary complaints. His wife notes that he is a bit unsteady when first getting up.     Objective: Blood pressure 139/71, pulse 70, temperature 96.9 F (36.1 C), temperature source Oral, resp. rate 19, height 6\' 1"  (1.854 m), weight 246 lb 9.6 oz (111.857 kg).  Oropharynx is without thrush or ulceration. No palpable cervical, supraclavicular or axillary lymph nodes. Lungs with scattered rhonchi and wheezes. Regular cardiac rhythm. Abdomen is soft and nontender. No hepatomegaly. Extremities are without edema. Motor strength 5 over 5  Lab Results: Lab Results  Component Value Date   WBC 7.3 05/26/2013   HGB 15.4 05/26/2013   HCT 45.0 05/26/2013   MCV 85.1 05/26/2013   PLT 249 05/26/2013    Chemistry:    Chemistry      Component Value Date/Time   NA 133* 05/26/2013 1313   NA  128* 04/10/2013 0330   NA 136 03/25/2012 0929   K 4.4 05/26/2013 1313   K 4.8 04/10/2013 0330   K 5.4* 03/25/2012 0929   CL 92* 04/10/2013 0330   CL 96* 03/25/2012 0929   CO2 27 05/26/2013 1313   CO2 29 04/10/2013 0330   CO2 31 03/25/2012 0929   BUN 13.0 05/26/2013 1313   BUN 14 04/10/2013 0330   BUN 10 03/25/2012 0929   CREATININE 1.0 05/26/2013 1313   CREATININE 0.90 04/10/2013 0330   CREATININE 1.0 03/25/2012 0929      Component Value Date/Time   CALCIUM 9.4 05/26/2013 1313   CALCIUM 9.0 04/10/2013 0330   CALCIUM 9.0 03/25/2012 0929   ALKPHOS 64 05/26/2013 1313   ALKPHOS 51 03/25/2012 0929   ALKPHOS 58 03/27/2011 0752   AST 17 05/26/2013 1313   AST 21 03/25/2012 0929   AST 22 03/27/2011 0752   ALT 12 05/26/2013 1313   ALT 20 03/25/2012 0929   ALT 14 03/27/2011 0752   BILITOT 0.71 05/26/2013 1313   BILITOT 1.20 03/25/2012 0929   BILITOT 0.7 03/27/2011 0752       Studies/Results: Dg Chest 1 View  05/08/2013   CLINICAL DATA:  79 year old male status post right lung mass CT-guided percutaneous needle biopsy.  EXAM: CHEST - 1 VIEW  COMPARISON:  CT-guided biopsy images 13 13 hr the same day. Chest CT 05/01/2013 and earlier.  FINDINGS: AP upright portable view at 1342 hrs. Right suprahilar region lung nodule re- identified, and appears more masslike than on radiographs from July. No pneumothorax identified.  Stable thickening along the right minor fissure. Stable cardiac size and mediastinal contours. Visualized tracheal air column is within normal limits. No new pulmonary opacity.  IMPRESSION: No pneumothorax or adverse features identified status post right upper lung mass biopsy.   Electronically Signed   By: Augusto Gamble   On: 05/08/2013 13:53   Ct Chest W Contrast  05/01/2013   **ADDENDUM** CREATED: 05/01/2013 12:36:32  These results were called by telephone on 05/01/2013 at 1235 hours to Dr Michell Heinrich, who verbally acknowledged these results.  If desired, the chest wall lesion could be percutaneously sampled/aspirated under  imaging guidance to confirm that it reflects a hematoma.  **END ADDENDUM** SIGNED BY: Charline Bills, M.D.  05/01/2013   *RADIOLOGY REPORT*  Clinical Data: New right middle lobe lung cancer, remote history of prior lung cancer status post resection.  New possible anterior chest mass on radiation treatment planning CT, not seen on the recent PET.  CT CHEST WITH CONTRAST  Technique:  Multidetector CT imaging of the chest was performed following the standard protocol during bolus administration of intravenous contrast.  Contrast: 80mL OMNIPAQUE IOHEXOL 300 MG/ML  SOLN  Comparison: Biopsy CT dated 04/09/2013.  PET CT dated 04/07/2013.  Findings: Status post right upper lobectomy.  Prior right lower lobe wedge resection (series 5/image 50).  4.4 x 4.0 cm right middle lobe mass (series 5/image 26), corresponding to biopsy proven lung cancer, previously 4.2 x 3.4 cm.  3.6 x 6.7 cm lesion along the right anterior chest wall / pleural surface (series 2/image 21), new.  While there may be scarring/communication between the lesion and the dominant right middle lobe mass (series 5/images 20-21), the rapid change and location are suspicious for loculated pleural hemorrhage related to the prior biopsy.  Tumor is difficult to exclude by imaging but is considered less likely.  Subpleural reticulation/scarring.  Left lower lobe pulmonary sequestration (series 5/image 54).  No pneumothorax.  Visualized thyroid is unremarkable.  The heart is normal in size.  No pericardial effusion.  Coronary atherosclerosis.  Atherosclerotic calcifications of the aortic arch.  Mildly prominent thoracic lymph nodes, grossly unchanged, including: --8 mm short-axis prevascular node (series 2/image 22) --11 mm short-axis right paratracheal node (series 2/image 27) --15 mm short-axis subcarinal node (series 2/image 35)  Visualized upper abdomen is unremarkable.  Degenerative changes of the visualized thoracolumbar spine.  IMPRESSION: Status post right  upper lobectomy and right lower lobe wedge resection.  4.4 cm right middle lobe mass, corresponding to known right lung cancer, increased.  2.6 x 6.7 cm lesion along the anterior chest wall, new, favored to reflect loculated pleural hemorrhage related to the prior biopsy. Tumor is not entirely excluded but is considered less likely given the rapid change.  Stable mildly prominent thoracic lymph nodes, as described above.   Original Report Authenticated By: Charline Bills, M.D.   Ct Aspiration  05/08/2013   *RADIOLOGY REPORT*  Clinical history:79 year old with right lung cancer.  Probable pleural hematoma following lung biopsy.  Request for aspiration of the hematoma prior to radiation treatment.  PROCEDURE(S): CT GUIDED ASPIRATION OF RIGHT PLEURAL FLUID COLLECTION  Physician: Rachelle Hora. Henn, MD  Medications:Versed 1 mg, Fentanyl 50 mcg. A radiology nurse monitored the patient for moderate sedation.  Moderate sedation time:15 minutes  Procedure:The procedure was explained to the patient.  The risks and benefits of the procedure were discussed and the patient's questions were addressed.  Informed consent was obtained from the patient.  The patient was placed supine on the CT  scanner.  Images of the upper chest were obtained.  The anterior pleural based collection was identified.  The anterior chest was prepped and draped in a sterile fashion.  Skin was anesthetized with lidocaine. A 5-French Yueh catheter was directed into the collection and 5 ml of bloody fluid was aspirated.  Catheter removed without complication.  Findings:Again noted is an anterior pleural collection in the right chest.  This collection roughly measured 6.1 x 2.4 cm and previously measured 6.7 x 3.6 cm.  Collection measured 6.1 x 2.0 cm after the aspiration.  Again noted is a mass in the right hilar region.  Complications: None  Impression:CT guided aspiration of the right anterior pleural collection.  Findings are most compatible with a  hematoma.  The collection had decreased in size since 05/01/2013 and the collection was smaller after the aspiration.  The fluid was sent for cytology.   Original Report Authenticated By: Richarda Overlie, M.D.    Medications: I have reviewed the patient's current medications.  Assessment/Plan:  1. 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 2. Metachronous primary right upper lobe and right lower lobe lung cancers treated with surgery. 3. Obstructive airway disease. 4. Type 2 diabetes. 5. Hypertension.  Disposition-Mr. Holzman began the course of radiation therapy on 05/19/2013. He is scheduled to complete the radiation on 07/06/2013. Dr. Cyndie Chime recommends a followup CT scan approximately 4 weeks after completion.  Mr. Beever will return for a followup visit in one month. He will contact the office in the interim with any problems.  Lonna Cobb ANP/GNP-BC

## 2013-05-27 ENCOUNTER — Ambulatory Visit
Admission: RE | Admit: 2013-05-27 | Discharge: 2013-05-27 | Disposition: A | Discharge status: Home / Self Care | Payer: MEDICARE | Source: Ambulatory Visit | Attending: Radiation Oncology | Admitting: Radiation Oncology

## 2013-05-27 ENCOUNTER — Ambulatory Visit: Payer: MEDICARE

## 2013-05-28 ENCOUNTER — Ambulatory Visit
Admission: RE | Admit: 2013-05-28 | Discharge: 2013-05-28 | Disposition: A | Discharge status: Home / Self Care | Payer: MEDICARE | Source: Ambulatory Visit | Attending: Radiation Oncology | Admitting: Radiation Oncology

## 2013-05-28 ENCOUNTER — Ambulatory Visit: Payer: MEDICARE

## 2013-05-29 ENCOUNTER — Ambulatory Visit: Payer: MEDICARE

## 2013-05-29 ENCOUNTER — Ambulatory Visit
Admission: RE | Admit: 2013-05-29 | Discharge: 2013-05-29 | Disposition: A | Discharge status: Home / Self Care | Payer: MEDICARE | Source: Ambulatory Visit | Attending: Radiation Oncology | Admitting: Radiation Oncology

## 2013-06-01 ENCOUNTER — Ambulatory Visit: Payer: MEDICARE

## 2013-06-01 ENCOUNTER — Ambulatory Visit
Admission: RE | Admit: 2013-06-01 | Discharge: 2013-06-01 | Disposition: A | Discharge status: Home / Self Care | Payer: MEDICARE | Source: Ambulatory Visit | Attending: Radiation Oncology | Admitting: Radiation Oncology

## 2013-06-02 ENCOUNTER — Ambulatory Visit
Admission: RE | Admit: 2013-06-02 | Discharge: 2013-06-02 | Disposition: A | Discharge status: Home / Self Care | Payer: MEDICARE | Source: Ambulatory Visit | Attending: Radiation Oncology | Admitting: Radiation Oncology

## 2013-06-02 ENCOUNTER — Ambulatory Visit: Payer: MEDICARE

## 2013-06-02 VITALS — BP 140/75 | HR 66 | Temp 98.1°F | Ht 73.0 in | Wt 246.0 lb

## 2013-06-02 DIAGNOSIS — C342 Malignant neoplasm of middle lobe, bronchus or lung: Secondary | ICD-10-CM

## 2013-06-02 NOTE — Progress Notes (Signed)
Weekly Management Note Current Dose:19.8 Gy  Projected Dose: 63 Gy   Narrative:  The patient presents for routine under treatment assessment.  CBCT/MVCT images/Port film x-rays were reviewed.  The chart was checked.n Cough is stable. Decreased appetite. Does like boost. Constipation resolving with metamucil. Have met with dietician.   Physical Findings:  Alert and oriented.   Vitals:  Filed Vitals:   06/02/13 1008  BP: 140/75  Pulse: 66  Temp: 98.1 F (36.7 C)   Weight:  Wt Readings from Last 3 Encounters:  06/02/13 246 lb (111.585 kg)  05/26/13 246 lb 9.6 oz (111.857 kg)  05/20/13 248 lb 1.6 oz (112.537 kg)   Lab Results  Component Value Date   WBC 7.3 05/26/2013   HGB 15.4 05/26/2013   HCT 45.0 05/26/2013   MCV 85.1 05/26/2013   PLT 249 05/26/2013   Lab Results  Component Value Date   CREATININE 1.0 05/26/2013   BUN 13.0 05/26/2013   NA 133* 05/26/2013   K 4.4 05/26/2013   CL 92* 04/10/2013   CO2 27 05/26/2013     Impression:  The patient is tolerating radiation.  Plan:  Continue treatment as planned. OK to use OTC cough suppressant. Encouraged supplementation with boost to make up for smaller meals.

## 2013-06-02 NOTE — Progress Notes (Signed)
Jose Gordon here with his wife for weekly under treat visit.  He has had 11 fractions to his right lung.  He denies pain.  He does have a cough and is bringing up sputum with a yellow tint.  He does have shortness of breath.  His oxygen saturation today is 98% on room air.  He denies a sore throat but says he has occasional tightness in his throat.  He does have a raised, red rash on the center of his chest.  He also has some redness on his right upper back.  Advised him to start applying biafine twice a day, after treatment and at bedtime.  He does have fatigue.

## 2013-06-03 ENCOUNTER — Ambulatory Visit
Admission: RE | Admit: 2013-06-03 | Discharge: 2013-06-03 | Disposition: A | Discharge status: Home / Self Care | Payer: MEDICARE | Source: Ambulatory Visit | Attending: Radiation Oncology | Admitting: Radiation Oncology

## 2013-06-03 ENCOUNTER — Ambulatory Visit: Payer: MEDICARE

## 2013-06-04 ENCOUNTER — Ambulatory Visit: Payer: MEDICARE

## 2013-06-04 ENCOUNTER — Ambulatory Visit
Admission: RE | Admit: 2013-06-04 | Discharge: 2013-06-04 | Disposition: A | Discharge status: Home / Self Care | Payer: MEDICARE | Source: Ambulatory Visit | Attending: Radiation Oncology | Admitting: Radiation Oncology

## 2013-06-05 ENCOUNTER — Ambulatory Visit: Payer: MEDICARE

## 2013-06-05 ENCOUNTER — Ambulatory Visit
Admission: RE | Admit: 2013-06-05 | Discharge: 2013-06-05 | Disposition: A | Discharge status: Home / Self Care | Payer: MEDICARE | Source: Ambulatory Visit | Attending: Radiation Oncology | Admitting: Radiation Oncology

## 2013-06-08 ENCOUNTER — Ambulatory Visit
Admission: RE | Admit: 2013-06-08 | Discharge: 2013-06-08 | Disposition: A | Discharge status: Home / Self Care | Payer: MEDICARE | Source: Ambulatory Visit | Attending: Radiation Oncology | Admitting: Radiation Oncology

## 2013-06-08 ENCOUNTER — Ambulatory Visit: Payer: MEDICARE

## 2013-06-09 ENCOUNTER — Ambulatory Visit
Admission: RE | Admit: 2013-06-09 | Discharge: 2013-06-09 | Disposition: A | Discharge status: Home / Self Care | Payer: MEDICARE | Source: Ambulatory Visit | Attending: Radiation Oncology | Admitting: Radiation Oncology

## 2013-06-09 ENCOUNTER — Ambulatory Visit: Payer: MEDICARE

## 2013-06-09 VITALS — BP 133/63 | HR 71 | Temp 97.4°F | Ht 73.0 in | Wt 246.6 lb

## 2013-06-09 DIAGNOSIS — C342 Malignant neoplasm of middle lobe, bronchus or lung: Secondary | ICD-10-CM

## 2013-06-09 MED ORDER — HYDROCODONE-ACETAMINOPHEN 7.5-325 MG/15ML PO SOLN: 15.0000 mL | Freq: Four times a day (QID) | ORAL | Status: DC | PRN

## 2013-06-09 MED ORDER — SUCRALFATE 1 GM/10ML PO SUSP: 1.0000 g | Freq: Four times a day (QID) | ORAL | Status: DC

## 2013-06-09 MED ORDER — BIAFINE EX EMUL
Freq: Two times a day (BID) | CUTANEOUS | Status: DC
  Administered 2013-06-09: 17:00:00 via TOPICAL

## 2013-06-09 NOTE — Progress Notes (Signed)
[facility]    Radiation Oncology [address] [place]     Maryln Gottron, M.D. [place], [place] [address]               Billie Lade, M.D., Ph.D. Phone: [phone]      Molli Hazard A. Kathrynn Running, M.D. Fax: [phone]      Radene Gunning, M.D., Ph.D.         Lurline Hare, M.D.         Grayland Jack, M.D Weekly Treatment Management Note  Name: Jose Gordon     MRN: [id number]        CSN: [id number] Date: 06/09/2013      DOB: 03-30-1929  CC: Gaspar Garbe, MD         [place]    Status: Outpatient  Diagnosis: The encounter diagnosis was Lung cancer, middle lobe.  Current Dose: 28.8 Gy  Current Fraction: 16  Planned Dose: 63 Gy  Narrative: Jose Gordon was seen today for weekly treatment management. The chart was checked and CBCT  were reviewed. He is starting to have some esophageal symptoms. I have given him Lortab elixir. This may also help with his coughing.  He has also been given Carafate for his esophageal symptoms.  Review of patient's allergies indicates no active allergies.  Current Outpatient Prescriptions  Medication Sig Dispense Refill  . albuterol (PROVENTIL) (2.5 MG/3ML) 0.083% nebulizer solution Take 2.5 mg by nebulization every 6 (six) hours as needed for wheezing.      Marland Kitchen ALPRAZolam (XANAX) 0.5 MG tablet Take 0.5 mg by mouth at bedtime.       . calcium-vitamin D (OSCAL WITH D) 500-200 MG-UNIT per tablet Take 1 tablet by mouth 2 (two) times daily.      Marland Kitchen emollient (BIAFINE) cream Apply topically 2 (two) times daily.      . finasteride (PROSCAR) 5 MG tablet Take 5 mg by mouth daily.      Marland Kitchen GLUCOSAMINE-CHONDROITIN PO Take 1 tablet by mouth 2 (two) times daily. 1500/1200 mg      . lidocaine (LIDODERM) 5 % Place 1 patch onto the skin daily as needed (for knee pain). Remove & Discard patch within 12 hours or as directed by MD,      . metoprolol tartrate (LOPRESSOR) 25 MG tablet Take 25 mg by mouth 2 (two) times daily.      . mirtazapine  (REMERON) 7.5 MG tablet Take 7.5 mg by mouth daily.      . montelukast (SINGULAIR) 10 MG tablet Take 10 mg by mouth daily.      . Multiple Vitamins-Minerals (ICAPS MV PO) Take 1 capsule by mouth 2 (two) times daily. With breakfast & supper      . naproxen sodium (ANAPROX) 220 MG tablet Take 220 mg by mouth 2 (two) times daily as needed (for pain).      Marland Kitchen omeprazole (PRILOSEC) 20 MG capsule Take 20 mg by mouth daily.      Marland Kitchen Umeclidinium-Vilanterol (ANORO ELLIPTA) 62.5-25 MCG/INH AEPB Inhale into the lungs daily.      Marland Kitchen HYDROcodone-acetaminophen (HYCET) 7.5-325 mg/15 ml solution Take 15 mLs by mouth 4 (four) times daily as needed for pain.  120 mL  0  . sucralfate (CARAFATE) 1 GM/10ML suspension Take 10 mLs (1 g total) by mouth 4 (four) times daily.  420 mL  0   No current facility-administered medications for this encounter.   Labs:  Lab Results  Component Value Date  WBC 7.3 05/26/2013   HGB 15.4 05/26/2013   HCT 45.0 05/26/2013   MCV 85.1 05/26/2013   PLT 249 05/26/2013   Lab Results  Component Value Date   CREATININE 1.0 05/26/2013   BUN 13.0 05/26/2013   NA 133* 05/26/2013   K 4.4 05/26/2013   CL 92* 04/10/2013   CO2 27 05/26/2013   Lab Results  Component Value Date   ALT 12 05/26/2013   AST 17 05/26/2013   BILITOT 0.71 05/26/2013    Physical Examination:  height is 6\' 1"  (1.854 m) and weight is 246 lb 9.6 oz (111.857 kg). His temperature is 97.4 F (36.3 C). His blood pressure is 133/63 and his pulse is 71. His oxygen saturation is 97%.    Wt Readings from Last 3 Encounters:  06/09/13 246 lb 9.6 oz (111.857 kg)  06/02/13 246 lb (111.585 kg)  05/26/13 246 lb 9.6 oz (111.857 kg)    The skin along the right mid chest area shows erythema and dry desquamation without moist desquamation. The back area also shows the same reaction. Lungs - Normal respiratory effort, chest expands symmetrically. Lungs are clear to auscultation, no crackles or wheezes.  Heart has regular rhythm and rate  Abdomen is  soft and non tender with normal bowel sounds  Assessment:  Patient tolerating treatments well  Plan: Continue treatment per original radiation prescription

## 2013-06-09 NOTE — Progress Notes (Addendum)
Emmaline Kluver here in a wheelchair with his wife for weekly under treat visit.  He has had 16 fractions to his right lung.  He denies pain.  He does have some burning in his throat and feels like food gets stuck in his mid chest.  He does have a cough and has shortness of breath with acitivty.  He states he had this before treatment started.  He does have occasional fatigue.  The skin on his right chest and right upper back is red and looks like a scattered rash.  He is using biafine cream twice a day.  He requested a refill on biafine cream.  Another tube was given.

## 2013-06-09 NOTE — Addendum Note (Signed)
Encounter addended by: Eduardo Osier, RN on: 06/09/2013  5:13 PM<BR>     Documentation filed: Inpatient MAR

## 2013-06-09 NOTE — Addendum Note (Signed)
Encounter addended by: Eduardo Osier, RN on: 06/09/2013 11:16 AM<BR>     Documentation filed: Notes Section, Orders

## 2013-06-10 ENCOUNTER — Ambulatory Visit: Payer: MEDICARE

## 2013-06-10 ENCOUNTER — Ambulatory Visit
Admission: RE | Admit: 2013-06-10 | Discharge: 2013-06-10 | Disposition: A | Discharge status: Home / Self Care | Payer: MEDICARE | Source: Ambulatory Visit | Attending: Radiation Oncology | Admitting: Radiation Oncology

## 2013-06-11 ENCOUNTER — Ambulatory Visit: Payer: MEDICARE

## 2013-06-11 ENCOUNTER — Ambulatory Visit
Admission: RE | Admit: 2013-06-11 | Discharge: 2013-06-11 | Disposition: A | Discharge status: Home / Self Care | Payer: MEDICARE | Source: Ambulatory Visit | Attending: Radiation Oncology | Admitting: Radiation Oncology

## 2013-06-12 ENCOUNTER — Ambulatory Visit: Payer: MEDICARE

## 2013-06-12 ENCOUNTER — Ambulatory Visit
Admission: RE | Admit: 2013-06-12 | Discharge: 2013-06-12 | Disposition: A | Discharge status: Home / Self Care | Payer: MEDICARE | Source: Ambulatory Visit | Attending: Radiation Oncology | Admitting: Radiation Oncology

## 2013-06-15 ENCOUNTER — Ambulatory Visit: Payer: MEDICARE

## 2013-06-15 ENCOUNTER — Ambulatory Visit
Admission: RE | Admit: 2013-06-15 | Discharge: 2013-06-15 | Disposition: A | Discharge status: Home / Self Care | Payer: MEDICARE | Source: Ambulatory Visit | Attending: Radiation Oncology | Admitting: Radiation Oncology

## 2013-06-16 ENCOUNTER — Ambulatory Visit: Payer: MEDICARE

## 2013-06-16 ENCOUNTER — Ambulatory Visit
Admission: RE | Admit: 2013-06-16 | Discharge: 2013-06-16 | Disposition: A | Discharge status: Home / Self Care | Payer: MEDICARE | Source: Ambulatory Visit | Attending: Radiation Oncology | Admitting: Radiation Oncology

## 2013-06-16 DIAGNOSIS — C342 Malignant neoplasm of middle lobe, bronchus or lung: Secondary | ICD-10-CM

## 2013-06-16 MED ORDER — SILVER SULFADIAZINE 1 % EX CREA
TOPICAL_CREAM | Freq: Every day | CUTANEOUS | Status: DC
  Administered 2013-06-16: 13:00:00 via TOPICAL

## 2013-06-16 NOTE — Progress Notes (Signed)
Gave 1 jar 50gm silvadene cream to patient, applied thin amount to front and back of right chest, informed patient to apply daily after rad txs, and must wash off before re applying  Any more cream with warm soap/water gently 1:06 PM

## 2013-06-16 NOTE — Progress Notes (Signed)
Jose Gordon here in a wheelchair with his wife for weekly under treat visit.  He has had 21 fractions to his right lung.  He denies pain.  He does feel a tightness in his throat when swallowing a big bite.  He states he has a frequent cough that is not any worse.  He does have shortness of breath with activity.  He has noticed that he does not have as much energy.  His skin on his right chest and right upper back is red and peeling.  He is using biafine cream twice a day.  He states that it is itchy.  Advised him that he can apply hydrocortisone cream to the itchy areas after treatment.

## 2013-06-16 NOTE — Addendum Note (Signed)
Encounter addended by: Lowella Petties, RN on: 06/16/2013  1:06 PM<BR>     Documentation filed: Inpatient Document Flowsheet, Vitals Section, Notes Section, Inpatient [facility]

## 2013-06-16 NOTE — Progress Notes (Signed)
[facility]    Radiation Oncology [address] [place]     Maryln Gottron, M.D. [place], [place] [address]               Billie Lade, M.D., Ph.D. Phone: [phone]      Molli Hazard A. Kathrynn Running, M.D. Fax: [phone]      Radene Gunning, M.D., Ph.D.         Lurline Hare, M.D.         Grayland Jack, M.D Weekly Treatment Management Note  Name: Jose Gordon     MRN: [id number]        CSN: [id number] Date: 06/16/2013      DOB: 09-Jun-1929  CC: Gaspar Garbe, MD         [place]    Status: Outpatient  Diagnosis: The encounter diagnosis was Lung cancer, middle lobe.  Current Dose: 37.8 Gy  Current Fraction: 21  Planned Dose: 63 Gy  Narrative: [name] was seen today for weekly treatment management. The chart was checked and CBCT  were reviewed. He continues to tolerate his treatments well. He denies any swallowing difficulties or itching.  Review of patient's allergies indicates no known allergies.  Current Outpatient Prescriptions  Medication Sig Dispense Refill  . albuterol (PROVENTIL) (2.5 MG/3ML) 0.083% nebulizer solution Take 2.5 mg by nebulization every 6 (six) hours as needed for wheezing.      Marland Kitchen ALPRAZolam (XANAX) 0.5 MG tablet Take 0.5 mg by mouth at bedtime.       . calcium-vitamin D (OSCAL WITH D) 500-200 MG-UNIT per tablet Take 1 tablet by mouth 2 (two) times daily.      Marland Kitchen emollient (BIAFINE) cream Apply topically 2 (two) times daily.      . finasteride (PROSCAR) 5 MG tablet Take 5 mg by mouth daily.      Marland Kitchen GLUCOSAMINE-CHONDROITIN PO Take 1 tablet by mouth 2 (two) times daily. 1500/1200 mg      . HYDROcodone-acetaminophen (HYCET) 7.5-325 mg/15 ml solution Take 15 mLs by mouth 4 (four) times daily as needed for pain.  120 mL  0  . lidocaine (LIDODERM) 5 % Place 1 patch onto the skin daily as needed (for knee pain). Remove & Discard patch within 12 hours or as directed by MD,      . metoprolol tartrate (LOPRESSOR) 25 MG tablet Take 25 mg  by mouth 2 (two) times daily.      . mirtazapine (REMERON) 7.5 MG tablet Take 7.5 mg by mouth daily.      . montelukast (SINGULAIR) 10 MG tablet Take 10 mg by mouth daily.      . Multiple Vitamins-Minerals (ICAPS MV PO) Take 1 capsule by mouth 2 (two) times daily. With breakfast & supper      . naproxen sodium (ANAPROX) 220 MG tablet Take 220 mg by mouth 2 (two) times daily as needed (for pain).      Marland Kitchen omeprazole (PRILOSEC) 20 MG capsule Take 20 mg by mouth daily.      . sucralfate (CARAFATE) 1 GM/10ML suspension Take 10 mLs (1 g total) by mouth 4 (four) times daily.  420 mL  0  . Umeclidinium-Vilanterol (ANORO ELLIPTA) 62.5-25 MCG/INH AEPB Inhale into the lungs daily.       No current facility-administered medications for this encounter.   Labs:  Lab Results  Component Value Date   WBC 7.3 05/26/2013   HGB 15.4 05/26/2013   HCT 45.0 05/26/2013   MCV 85.1 05/26/2013  PLT 249 05/26/2013   Lab Results  Component Value Date   CREATININE 1.0 05/26/2013   BUN 13.0 05/26/2013   NA 133* 05/26/2013   K 4.4 05/26/2013   CL 92* 04/10/2013   CO2 27 05/26/2013   Lab Results  Component Value Date   ALT 12 05/26/2013   AST 17 05/26/2013   BILITOT 0.71 05/26/2013    Physical Examination:  height is 6\' 1"  (1.854 m) and weight is 246 lb 8 oz (111.812 kg). His temperature is 97.5 F (36.4 C). His blood pressure is 126/66 and his pulse is 77. His oxygen saturation is 97%.    Wt Readings from Last 3 Encounters:  06/16/13 246 lb 8 oz (111.812 kg)  06/09/13 246 lb 9.6 oz (111.857 kg)  06/02/13 246 lb (111.585 kg)    The patient has a brisk reaction in the back area with erythema and dry desquamation, impending moist desquamation. He also has skin reaction along the anterior chest region Lungs - Normal respiratory effort, chest expands symmetrically. Lungs are clear to auscultation, no crackles or wheezes.  Heart has regular rhythm and rate  Abdomen is soft and non tender with normal bowel sounds  Assessment:   Patient tolerating treatments well  Plan: Continue treatment per original radiation prescription. He was given Silvadene today for his skin reaction. I will also increase the energy on his beams to limit skin dose.. This will decrease the coverage slightly along the target area but I am concerned that the patient will develop significant skin problems with the current set up.

## 2013-06-16 NOTE — Addendum Note (Signed)
Encounter addended by: Lowella Petties, RN on: 06/16/2013  1:03 PM<BR>     Documentation filed: Orders

## 2013-06-17 ENCOUNTER — Ambulatory Visit: Payer: MEDICARE

## 2013-06-17 ENCOUNTER — Ambulatory Visit
Admission: RE | Admit: 2013-06-17 | Discharge: 2013-06-17 | Disposition: A | Discharge status: Home / Self Care | Payer: MEDICARE | Source: Ambulatory Visit | Attending: Radiation Oncology | Admitting: Radiation Oncology

## 2013-06-18 ENCOUNTER — Ambulatory Visit
Admission: RE | Admit: 2013-06-18 | Discharge: 2013-06-18 | Disposition: A | Discharge status: Home / Self Care | Payer: MEDICARE | Source: Ambulatory Visit | Attending: Radiation Oncology | Admitting: Radiation Oncology

## 2013-06-18 ENCOUNTER — Ambulatory Visit: Payer: MEDICARE

## 2013-06-19 ENCOUNTER — Ambulatory Visit: Payer: MEDICARE

## 2013-06-19 ENCOUNTER — Ambulatory Visit
Admission: RE | Admit: 2013-06-19 | Discharge: 2013-06-19 | Disposition: A | Discharge status: Home / Self Care | Payer: MEDICARE | Source: Ambulatory Visit | Attending: Radiation Oncology | Admitting: Radiation Oncology

## 2013-06-22 ENCOUNTER — Ambulatory Visit
Admission: RE | Admit: 2013-06-22 | Discharge: 2013-06-22 | Disposition: A | Discharge status: Home / Self Care | Payer: MEDICARE | Source: Ambulatory Visit | Attending: Radiation Oncology | Admitting: Radiation Oncology

## 2013-06-22 ENCOUNTER — Ambulatory Visit: Payer: MEDICARE

## 2013-06-23 ENCOUNTER — Ambulatory Visit: Payer: MEDICARE

## 2013-06-23 ENCOUNTER — Ambulatory Visit
Admission: RE | Admit: 2013-06-23 | Discharge: 2013-06-23 | Disposition: A | Discharge status: Home / Self Care | Payer: MEDICARE | Source: Ambulatory Visit | Attending: Radiation Oncology | Admitting: Radiation Oncology

## 2013-06-23 VITALS — BP 123/76 | HR 86 | Temp 97.6°F | Ht 73.0 in | Wt 245.7 lb

## 2013-06-23 DIAGNOSIS — C342 Malignant neoplasm of middle lobe, bronchus or lung: Secondary | ICD-10-CM

## 2013-06-23 NOTE — Progress Notes (Signed)
Jose Gordon here with his wife for under treat visit.  He has had 26 fractions to his right lung.  He denies pain.  He denies a sore throat.  He does have a cough and shortness of breath but says it is not any worse.  His oxygen saturation today was 97% on room air.  He denies coughing up blood.  He is fatigued and it seems to be worse in the afternoons.  His skin on his right chest is red.  The skin on his upper right back is peeling, scabbed and red.  He is using silvadene and biafine cream.

## 2013-06-23 NOTE — Progress Notes (Signed)
[facility]    Radiation Oncology [address] [place]     Maryln Gottron, M.D. [place], [place] [address]               Billie Lade, M.D., Ph.D. Phone: [phone]      Molli Hazard A. Kathrynn Running, M.D. Fax: [phone]      Radene Gunning, M.D., Ph.D.         Lurline Hare, M.D.         Grayland Jack, M.D Weekly Treatment Management Note  Name: Jose Gordon     MRN: [id number]        CSN: [id number] Date: 06/23/2013      DOB: 12-11-1928  CC: Gaspar Garbe, MD         [place]    Status: Outpatient  Diagnosis: The encounter diagnosis was Lung cancer, middle lobe.  Current Dose: 46.8 Gy  Current Fraction: 26  Planned Dose: 63 Gy  Narrative: Jose Gordon was seen today for weekly treatment management. The chart was checked and CBCT  were reviewed.  He has mild esophageal problems but is able to eat solid foods at this time. He has some dyspnea with exertion but is comfortable breathing when resting. He continues to have Silvadene placed along his back area.  Review of patient's allergies indicates no known allergies.  Current Outpatient Prescriptions  Medication Sig Dispense Refill  . albuterol (PROVENTIL) (2.5 MG/3ML) 0.083% nebulizer solution Take 2.5 mg by nebulization every 6 (six) hours as needed for wheezing.      Marland Kitchen ALPRAZolam (XANAX) 0.5 MG tablet Take 0.5 mg by mouth at bedtime.       . calcium-vitamin D (OSCAL WITH D) 500-200 MG-UNIT per tablet Take 1 tablet by mouth 2 (two) times daily.      Marland Kitchen emollient (BIAFINE) cream Apply topically 2 (two) times daily.      . finasteride (PROSCAR) 5 MG tablet Take 5 mg by mouth daily.      Marland Kitchen GLUCOSAMINE-CHONDROITIN PO Take 1 tablet by mouth 2 (two) times daily. 1500/1200 mg      . lidocaine (LIDODERM) 5 % Place 1 patch onto the skin daily as needed (for knee pain). Remove & Discard patch within 12 hours or as directed by MD,      . metoprolol tartrate (LOPRESSOR) 25 MG tablet Take 25 mg by mouth 2 (two)  times daily.      . mirtazapine (REMERON) 7.5 MG tablet Take 7.5 mg by mouth daily.      . montelukast (SINGULAIR) 10 MG tablet Take 10 mg by mouth daily.      . Multiple Vitamins-Minerals (ICAPS MV PO) Take 1 capsule by mouth 2 (two) times daily. With breakfast & supper      . naproxen sodium (ANAPROX) 220 MG tablet Take 220 mg by mouth 2 (two) times daily as needed (for pain).      Marland Kitchen omeprazole (PRILOSEC) 20 MG capsule Take 20 mg by mouth daily.      . silver sulfADIAZINE (SILVADENE) 1 % cream Apply 1 application topically 2 (two) times daily.      Marland Kitchen Umeclidinium-Vilanterol (ANORO ELLIPTA) 62.5-25 MCG/INH AEPB Inhale into the lungs daily.      Marland Kitchen HYDROcodone-acetaminophen (HYCET) 7.5-325 mg/15 ml solution Take 15 mLs by mouth 4 (four) times daily as needed for pain.  120 mL  0  . sucralfate (CARAFATE) 1 GM/10ML suspension Take 10 mLs (1 g total) by mouth 4 (four) times daily.  420 mL  0   No current facility-administered medications for this encounter.   Labs:  Lab Results  Component Value Date   WBC 7.3 05/26/2013   HGB 15.4 05/26/2013   HCT 45.0 05/26/2013   MCV 85.1 05/26/2013   PLT 249 05/26/2013   Lab Results  Component Value Date   CREATININE 1.0 05/26/2013   BUN 13.0 05/26/2013   NA 133* 05/26/2013   K 4.4 05/26/2013   CL 92* 04/10/2013   CO2 27 05/26/2013   Lab Results  Component Value Date   ALT 12 05/26/2013   AST 17 05/26/2013   BILITOT 0.71 05/26/2013    Physical Examination:  height is 6\' 1"  (1.854 m) and weight is 245 lb 11.2 oz (111.449 kg). His temperature is 97.6 F (36.4 C). His blood pressure is 123/76 and his pulse is 86. His oxygen saturation is 97%.    Wt Readings from Last 3 Encounters:  06/23/13 245 lb 11.2 oz (111.449 kg)  06/16/13 246 lb 8 oz (111.812 kg)  06/09/13 246 lb 9.6 oz (111.857 kg)     Lungs - Normal respiratory effort, chest expands symmetrically. Lungs are clear to auscultation, no crackles or wheezes.  Heart has regular rhythm and rate  Abdomen is soft  and non tender with normal bowel sounds The back area shows erythema and dry desquamation but no active moist desquamation or signs of infection. Assessment:  Patient tolerating treatments well  Plan: Continue treatment per original radiation prescription

## 2013-06-24 ENCOUNTER — Ambulatory Visit: Payer: MEDICARE

## 2013-06-24 ENCOUNTER — Other Ambulatory Visit (HOSPITAL_BASED_OUTPATIENT_CLINIC_OR_DEPARTMENT_OTHER): Payer: MEDICARE | Admitting: Lab

## 2013-06-24 ENCOUNTER — Ambulatory Visit
Admission: RE | Admit: 2013-06-24 | Discharge: 2013-06-24 | Disposition: A | Discharge status: Home / Self Care | Payer: MEDICARE | Source: Ambulatory Visit | Attending: Radiation Oncology | Admitting: Radiation Oncology

## 2013-06-24 ENCOUNTER — Ambulatory Visit (HOSPITAL_BASED_OUTPATIENT_CLINIC_OR_DEPARTMENT_OTHER): Payer: MEDICARE | Admitting: Nurse Practitioner

## 2013-06-24 DIAGNOSIS — C3410 Malignant neoplasm of upper lobe, unspecified bronchus or lung: Secondary | ICD-10-CM

## 2013-06-24 DIAGNOSIS — C3430 Malignant neoplasm of lower lobe, unspecified bronchus or lung: Secondary | ICD-10-CM

## 2013-06-24 DIAGNOSIS — C342 Malignant neoplasm of middle lobe, bronchus or lung: Secondary | ICD-10-CM

## 2013-06-24 DIAGNOSIS — E119 Type 2 diabetes mellitus without complications: Secondary | ICD-10-CM

## 2013-06-24 DIAGNOSIS — I10 Essential (primary) hypertension: Secondary | ICD-10-CM

## 2013-06-24 DIAGNOSIS — R1310 Dysphagia, unspecified: Secondary | ICD-10-CM

## 2013-06-24 LAB — CBC WITH DIFFERENTIAL/PLATELET
Basophils Absolute: 0 10*3/uL (ref 0.0–0.1)
EOS%: 6.9 % (ref 0.0–7.0)
HCT: 48.2 % (ref 38.4–49.9)
HGB: 16.7 g/dL (ref 13.0–17.1)
MCH: 28.4 pg (ref 27.2–33.4)
MONO#: 1.2 10*3/uL — ABNORMAL HIGH (ref 0.1–0.9)
NEUT#: 5.3 10*3/uL (ref 1.5–6.5)
NEUT%: 70.1 % (ref 39.0–75.0)
RDW: 13.7 % (ref 11.0–14.6)
WBC: 7.6 10*3/uL (ref 4.0–10.3)
lymph#: 0.5 10*3/uL — ABNORMAL LOW (ref 0.9–3.3)

## 2013-06-24 LAB — COMPREHENSIVE METABOLIC PANEL (CC13)
ALT: 12 U/L (ref 0–55)
AST: 17 U/L (ref 5–34)
Albumin: 3.2 g/dL — ABNORMAL LOW (ref 3.5–5.0)
Anion Gap: 7 mEq/L (ref 3–11)
BUN: 13.1 mg/dL (ref 7.0–26.0)
CO2: 28 mEq/L (ref 22–29)
Calcium: 9.7 mg/dL (ref 8.4–10.4)
Chloride: 96 mEq/L — ABNORMAL LOW (ref 98–109)
Creatinine: 1 mg/dL (ref 0.7–1.3)
Potassium: 5 mEq/L (ref 3.5–5.1)

## 2013-06-24 NOTE — Progress Notes (Signed)
OFFICE PROGRESS NOTE  Interval history:   Mr. 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 Most recently he was found to have a mass in the right middle lobe. PET scan showed significant uptake in the primary lung mass with minimal uptake in the mediastinum. 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 He developed significant hemoptysis following the biopsy procedure and was admitted overnight for observation.   Followup CT for radiation planning showed what was felt to be a loculated pleural hemorrhage related to the prior biopsy. He underwent CT-guided aspiration of the right pleural collection in interventional radiology on 05/08/2013 with 5 cc of fluid removed. No malignant cells were identified.   He began a course of radiation therapy on 05/19/2013.  He presents today for scheduled followup. He notes redness and dry peeling skin over the chest and back. He is applying Silvadene cream and Biafine. He denies odynophagia. He has mild dysphagia. Appetite is diminished. His wife thinks he's lost a couple of pounds. He is drinking ensure. He denies nausea/vomiting. No mouth sores. No diarrhea. He reports stable dyspnea on exertion. He has a persistent cough which has been ongoing since June of this year. No fever or chills. He denies pain. No leg swelling.   Objective: Blood pressure 127/80, pulse 90, temperature 97.4 F (36.3 C), temperature source Oral, resp. rate 20, height 6\' 1"  (1.854 m), weight 244 lb 8 oz (110.904 kg).  Oropharynx is without thrush or ulceration. No palpable cervical, supraclavicular or axillary lymph nodes. Lungs with expiratory rhonchi which clear with coughing. Regular cardiac rhythm. No murmur. Abdomen is soft and nontender. No hepatomegaly. Extremities without edema. Motor strength 5 over 5. Skin with dry  desquamation over the right anterior and posterior chest, erythematous base. He is hard of hearing. Alert and oriented.  Lab Results: Lab Results  Component Value Date   WBC 7.6 06/24/2013   HGB 16.7 06/24/2013   HCT 48.2 06/24/2013   MCV 82.1 06/24/2013   PLT 194 06/24/2013    Chemistry:    Chemistry      Component Value Date/Time   NA 131* 06/24/2013 0934   NA 128* 04/10/2013 0330   NA 136 03/25/2012 0929   K 5.0 06/24/2013 0934   K 4.8 04/10/2013 0330   K 5.4* 03/25/2012 0929   CL 92* 04/10/2013 0330   CL 96* 03/25/2012 0929   CO2 28 06/24/2013 0934   CO2 29 04/10/2013 0330   CO2 31 03/25/2012 0929   BUN 13.1 06/24/2013 0934   BUN 14 04/10/2013 0330   BUN 10 03/25/2012 0929   CREATININE 1.0 06/24/2013 0934   CREATININE 0.90 04/10/2013 0330   CREATININE 1.0 03/25/2012 0929      Component Value Date/Time   CALCIUM 9.7 06/24/2013 0934   CALCIUM 9.0 04/10/2013 0330   CALCIUM 9.0 03/25/2012 0929   ALKPHOS 76 06/24/2013 0934   ALKPHOS 51 03/25/2012 0929   ALKPHOS 58 03/27/2011 0752   AST 17 06/24/2013 0934   AST 21 03/25/2012 0929   AST 22 03/27/2011 0752   ALT 12 06/24/2013 0934   ALT 20 03/25/2012 0929   ALT 14 03/27/2011 0752   BILITOT 0.58 06/24/2013 0934   BILITOT 1.20 03/25/2012 0929   BILITOT 0.7 03/27/2011 0752       Studies/Results: No results found.  Medications: I have reviewed the patient's current  medications.  Assessment/Plan:  1. 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 2. Metachronous primary right upper lobe and right lower lobe lung cancers treated with surgery. 3. Obstructive airway disease. 4. Type 2 diabetes. 5. Hypertension. 6. 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 He is applying Silvadene and Biafine. 7. Mild dysphagia, question secondary to radiation. He has Carafate should he develop odynophagia. He understands to contact the office if unable to maintain adequate oral hydration.  Disposition-Mr. Riner  appears stable. He is scheduled to complete the course of radiation on 07/06/2013. Restaging CT scan of the chest is scheduled on 08/03/2013. He has a followup visit with Dr. Cyndie Chime on 08/07/2013. He will contact the office in the interim as outlined above or with any other problems.  Lonna Cobb ANP/GNP-BC

## 2013-06-25 ENCOUNTER — Ambulatory Visit
Admission: RE | Admit: 2013-06-25 | Discharge: 2013-06-25 | Disposition: A | Discharge status: Home / Self Care | Payer: MEDICARE | Source: Ambulatory Visit | Attending: Radiation Oncology | Admitting: Radiation Oncology

## 2013-06-26 ENCOUNTER — Ambulatory Visit
Admission: RE | Admit: 2013-06-26 | Discharge: 2013-06-26 | Disposition: A | Discharge status: Home / Self Care | Payer: MEDICARE | Source: Ambulatory Visit | Attending: Radiation Oncology | Admitting: Radiation Oncology

## 2013-06-29 ENCOUNTER — Ambulatory Visit
Admission: RE | Admit: 2013-06-29 | Discharge: 2013-06-29 | Disposition: A | Discharge status: Home / Self Care | Payer: MEDICARE | Source: Ambulatory Visit | Attending: Radiation Oncology | Admitting: Radiation Oncology

## 2013-06-30 ENCOUNTER — Ambulatory Visit
Admission: RE | Admit: 2013-06-30 | Discharge: 2013-06-30 | Disposition: A | Discharge status: Home / Self Care | Payer: MEDICARE | Source: Ambulatory Visit | Attending: Radiation Oncology | Admitting: Radiation Oncology

## 2013-06-30 ENCOUNTER — Encounter: Payer: Self-pay | Admitting: Radiation Oncology

## 2013-06-30 VITALS — BP 136/66 | HR 84 | Temp 97.4°F | Resp 18 | Wt 245.0 lb

## 2013-06-30 DIAGNOSIS — C342 Malignant neoplasm of middle lobe, bronchus or lung: Secondary | ICD-10-CM

## 2013-06-30 MED ORDER — BIAFINE EX EMUL
Freq: Every day | CUTANEOUS | Status: DC
  Administered 2013-06-30: 10:00:00 via TOPICAL

## 2013-06-30 NOTE — Progress Notes (Signed)
[facility]    Radiation Oncology [address] [place]     Maryln Gottron, M.D. [place], [place] [address]               Billie Lade, M.D., Ph.D. Phone: [phone]      Molli Hazard A. Kathrynn Running, M.D. Fax: [phone]      Radene Gunning, M.D., Ph.D.         Lurline Hare, M.D.         Grayland Jack, M.D Weekly Treatment Management Note  Name: Jose Gordon     MRN: [id number]        CSN: [id number] Date: 06/30/2013      DOB: [birth year]  CC: Gaspar Garbe, MD         [place]    Status: Outpatient  Diagnosis: The encounter diagnosis was Lung cancer, middle lobe.  Current Dose: 55.8 Gy  Current Fraction: 31  Planned Dose: 63 Gy  Narrative: Jose Gordon was seen today for weekly treatment management. The chart was checked and CBCT  were reviewed. He is having some fatigue. He denies any significant swallowing problems at this time. He has some mild itching along the back area. He has had a dry cough which is bothersome. I recommended he try Robitussin-DM.  Review of patient's allergies indicates no known allergies.  Current Outpatient Prescriptions  Medication Sig Dispense Refill  . albuterol (PROVENTIL) (2.5 MG/3ML) 0.083% nebulizer solution Take 2.5 mg by nebulization every 6 (six) hours as needed for wheezing.      Marland Kitchen ALPRAZolam (XANAX) 0.5 MG tablet Take 0.5 mg by mouth at bedtime.       . calcium-vitamin D (OSCAL WITH D) 500-200 MG-UNIT per tablet Take 1 tablet by mouth 2 (two) times daily.      Marland Kitchen emollient (BIAFINE) cream Apply topically 2 (two) times daily.      . finasteride (PROSCAR) 5 MG tablet Take 5 mg by mouth daily.      Marland Kitchen GLUCOSAMINE-CHONDROITIN PO Take 1 tablet by mouth 2 (two) times daily. 1500/1200 mg      . HYDROcodone-acetaminophen (HYCET) 7.5-325 mg/15 ml solution Take 15 mLs by mouth 4 (four) times daily as needed for pain.  120 mL  0  . lidocaine (LIDODERM) 5 % Place 1 patch onto the skin daily as needed (for knee pain). Remove  & Discard patch within 12 hours or as directed by MD,      . metoprolol tartrate (LOPRESSOR) 25 MG tablet Take 25 mg by mouth 2 (two) times daily.      . mirtazapine (REMERON) 7.5 MG tablet Take 7.5 mg by mouth daily.      . montelukast (SINGULAIR) 10 MG tablet Take 10 mg by mouth daily.      . Multiple Vitamins-Minerals (ICAPS MV PO) Take 1 capsule by mouth 2 (two) times daily. With breakfast & supper      . naproxen sodium (ANAPROX) 220 MG tablet Take 220 mg by mouth 2 (two) times daily as needed (for pain).      Marland Kitchen omeprazole (PRILOSEC) 20 MG capsule Take 20 mg by mouth daily.      . silver sulfADIAZINE (SILVADENE) 1 % cream Apply 1 application topically 2 (two) times daily.      . sucralfate (CARAFATE) 1 GM/10ML suspension Take 10 mLs (1 g total) by mouth 4 (four) times daily.  420 mL  0  . Umeclidinium-Vilanterol (ANORO ELLIPTA) 62.5-25 MCG/INH AEPB Inhale into the lungs daily.  Current Facility-Administered Medications  Medication Dose Route Frequency Provider Last Rate Last Dose  . topical emolient (BIAFINE) emulsion   Topical Daily Billie Lade, MD       Labs:  Lab Results  Component Value Date   WBC 7.6 06/24/2013   HGB 16.7 06/24/2013   HCT 48.2 06/24/2013   MCV 82.1 06/24/2013   PLT 194 06/24/2013   Lab Results  Component Value Date   CREATININE 1.0 06/24/2013   BUN 13.1 06/24/2013   NA 131* 06/24/2013   K 5.0 06/24/2013   CL 92* 04/10/2013   CO2 28 06/24/2013   Lab Results  Component Value Date   ALT 12 06/24/2013   AST 17 06/24/2013   BILITOT 0.58 06/24/2013    Physical Examination:  weight is 245 lb (111.131 kg). His oral temperature is 97.4 F (36.3 C). His blood pressure is 136/66 and his pulse is 84. His respiration is 18 and oxygen saturation is 97%.    Wt Readings from Last 3 Encounters:  06/30/13 245 lb (111.131 kg)  06/24/13 244 lb 8 oz (110.904 kg)  06/23/13 245 lb 11.2 oz (111.449 kg)    The back area shows erythema and dry desquamation without moist  desquamation. Lungs - Normal respiratory effort, chest expands symmetrically. Lungs are clear to auscultation, no crackles or wheezes.  Heart has regular rhythm and rate  Abdomen is soft and non tender with normal bowel sounds  Assessment:  Patient tolerating treatments well  Plan: Continue treatment per original radiation prescription

## 2013-06-30 NOTE — Progress Notes (Signed)
Reports persistent productive cough with clear sputum. Denies hemoptysis. Hoarseness noted. Reports difficulty only when swallowing large amounts of food at one time. Denies sore throat or pain. Reports shortness of breath with mild exertion. Reports fatigue. Provided patient with another tube of biafine. Reports hyperpigmentation without desquamation of treatment area.

## 2013-07-01 ENCOUNTER — Ambulatory Visit
Admission: RE | Admit: 2013-07-01 | Discharge: 2013-07-01 | Disposition: A | Discharge status: Home / Self Care | Payer: MEDICARE | Source: Ambulatory Visit | Attending: Radiation Oncology | Admitting: Radiation Oncology

## 2013-07-02 ENCOUNTER — Ambulatory Visit
Admission: RE | Admit: 2013-07-02 | Discharge: 2013-07-02 | Disposition: A | Discharge status: Home / Self Care | Payer: MEDICARE | Source: Ambulatory Visit | Attending: Radiation Oncology | Admitting: Radiation Oncology

## 2013-07-03 ENCOUNTER — Ambulatory Visit
Admission: RE | Admit: 2013-07-03 | Discharge: 2013-07-03 | Disposition: A | Discharge status: Home / Self Care | Payer: MEDICARE | Source: Ambulatory Visit | Attending: Radiation Oncology | Admitting: Radiation Oncology

## 2013-07-06 ENCOUNTER — Ambulatory Visit
Admission: RE | Admit: 2013-07-06 | Discharge: 2013-07-06 | Disposition: A | Discharge status: Home / Self Care | Payer: MEDICARE | Source: Ambulatory Visit | Attending: Radiation Oncology | Admitting: Radiation Oncology

## 2013-07-06 VITALS — BP 129/90 | HR 90 | Temp 98.2°F | Ht 73.0 in

## 2013-07-06 DIAGNOSIS — C342 Malignant neoplasm of middle lobe, bronchus or lung: Secondary | ICD-10-CM

## 2013-07-06 MED ORDER — BIAFINE EX EMUL
Freq: Two times a day (BID) | CUTANEOUS | Status: DC
  Administered 2013-07-06: 12:00:00 via TOPICAL

## 2013-07-06 MED ORDER — HYDROCOD POLST-CHLORPHEN POLST 10-8 MG/5ML PO LQCR: 5.0000 mL | Freq: Two times a day (BID) | ORAL | Status: DC | PRN

## 2013-07-06 NOTE — Addendum Note (Signed)
Encounter addended by: Eduardo Osier, RN on: 07/06/2013 10:59 AM<BR>     Documentation filed: Orders

## 2013-07-06 NOTE — Progress Notes (Signed)
[facility]    Radiation Oncology [address] [place]     Maryln Gottron, M.D. [place], [place] [address]               Billie Lade, M.D., Ph.D. Phone: [phone]      Molli Hazard A. Kathrynn Running, M.D. Fax: [phone]      Radene Gunning, M.D., Ph.D.         Lurline Hare, M.D.         Grayland Jack, M.D Weekly Treatment Management Note  Name: TRAMPAS STETTNER     MRN: [id number]        CSN: [id number] Date: 07/06/2013      DOB: [birth year]  CC: Gaspar Garbe, MD         [place]    Status: Outpatient  Diagnosis: The encounter diagnosis was Lung cancer, middle lobe.  Current Dose: 63 Gy  Current Fraction: 35  Planned Dose: 63 Gy  Narrative: [name] was seen today for weekly treatment management. The chart was checked and CBCT  were reviewed. He is happy to complete his therapy today. He continues to have a dry irritating cough which did not respond well to over-the-counter cough medicine. He denies any chills or fever. He denies a productive cough. His cough is keeping him awake at night. I have given him a prescription for Tussionex in light of these issues. He does have some fatigue and some mild swallowing difficulties.  Review of patient's allergies indicates no known allergies.  Current Outpatient Prescriptions  Medication Sig Dispense Refill  . albuterol (PROVENTIL) (2.5 MG/3ML) 0.083% nebulizer solution Take 2.5 mg by nebulization every 6 (six) hours as needed for wheezing.      Marland Kitchen ALPRAZolam (XANAX) 0.5 MG tablet Take 0.5 mg by mouth at bedtime.       . calcium-vitamin D (OSCAL WITH D) 500-200 MG-UNIT per tablet Take 1 tablet by mouth 2 (two) times daily.      Marland Kitchen emollient (BIAFINE) cream Apply topically 2 (two) times daily.      . finasteride (PROSCAR) 5 MG tablet Take 5 mg by mouth daily.      Marland Kitchen GLUCOSAMINE-CHONDROITIN PO Take 1 tablet by mouth 2 (two) times daily. 1500/1200 mg      . guaiFENesin-dextromethorphan (ROBITUSSIN DM) 100-10  MG/5ML syrup Take 5 mLs by mouth 3 (three) times daily as needed for cough.      . lidocaine (LIDODERM) 5 % Place 1 patch onto the skin daily as needed (for knee pain). Remove & Discard patch within 12 hours or as directed by MD,      . metoprolol tartrate (LOPRESSOR) 25 MG tablet Take 25 mg by mouth 2 (two) times daily.      . mirtazapine (REMERON) 7.5 MG tablet Take 7.5 mg by mouth daily.      . montelukast (SINGULAIR) 10 MG tablet Take 10 mg by mouth daily.      . Multiple Vitamins-Minerals (ICAPS MV PO) Take 1 capsule by mouth 2 (two) times daily. With breakfast & supper      . naproxen sodium (ANAPROX) 220 MG tablet Take 220 mg by mouth 2 (two) times daily as needed (for pain).      Marland Kitchen omeprazole (PRILOSEC) 20 MG capsule Take 20 mg by mouth daily.      . silver sulfADIAZINE (SILVADENE) 1 % cream Apply 1 application topically 2 (two) times daily.      Marland Kitchen Umeclidinium-Vilanterol (ANORO ELLIPTA) 62.5-25 MCG/INH AEPB  Inhale into the lungs daily.      . chlorpheniramine-HYDROcodone (TUSSIONEX) 10-8 MG/5ML LQCR Take 5 mLs by mouth every 12 (twelve) hours as needed (cough).  115 mL  0  . HYDROcodone-acetaminophen (HYCET) 7.5-325 mg/15 ml solution Take 15 mLs by mouth 4 (four) times daily as needed for pain.  120 mL  0  . sucralfate (CARAFATE) 1 GM/10ML suspension Take 10 mLs (1 g total) by mouth 4 (four) times daily.  420 mL  0   No current facility-administered medications for this encounter.   Labs:  Lab Results  Component Value Date   WBC 7.6 06/24/2013   HGB 16.7 06/24/2013   HCT 48.2 06/24/2013   MCV 82.1 06/24/2013   PLT 194 06/24/2013   Lab Results  Component Value Date   CREATININE 1.0 06/24/2013   BUN 13.1 06/24/2013   NA 131* 06/24/2013   K 5.0 06/24/2013   CL 92* 04/10/2013   CO2 28 06/24/2013   Lab Results  Component Value Date   ALT 12 06/24/2013   AST 17 06/24/2013   BILITOT 0.58 06/24/2013    Physical Examination:  height is 6\' 1"  (1.854 m). His temperature is 98.2 F (36.8 C).  His blood pressure is 129/90 and his pulse is 90. His oxygen saturation is 97%.    Wt Readings from Last 3 Encounters:  06/30/13 245 lb (111.131 kg)  06/24/13 244 lb 8 oz (110.904 kg)  06/23/13 245 lb 11.2 oz (111.449 kg)    The chest area shows hyperpigmentation changes and dry desquamation but no moist desquamation. Lungs - Normal respiratory effort, chest expands symmetrically. Lungs are clear to auscultation, no crackles or wheezes.  Heart has regular rhythm and rate  Abdomen is soft and non tender with normal bowel sounds  Assessment:  Patient tolerated treatments well except for issues as above  Plan: routine followup in one month.

## 2013-07-06 NOTE — Progress Notes (Signed)
  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   He did experience some fatigue as well as some mild swallowing difficulties during the course of his treatment. He also continue to have a dry irritating cough for which Tussionex was prescribed on his last day of treatment. On daily cone beam CT scans the patient's tumor appeared to decrease in size.  Plan: The patient has completed radiation treatment. The patient will return to radiation oncology clinic for routine followup in one month. I advised them to call or return sooner if they have any questions or concerns related to their recovery or treatment.  -----------------------------------  Billie Lade, PhD, MD

## 2013-07-06 NOTE — Progress Notes (Signed)
Jose Gordon here in a wheelchair with his wife.  He has had 35 fractions to his right lung.  He denies pain but does have some pain with swallowing.  He says it feels like food goes over a lump in his throat.  He has not been taking Carafate or the hycet.  Reinforced what they were used for.  He does have a frequent dry cough and has been using robitussin DM over the counter.  He is wondering if there is anything else he can use that would work better.  He thinks he is more short of breath now.  His oxygen saturation sitting was 97%.  He thinks he has lost about 3 lbs.  He denies dizziness when standing.  He is feeling weak and tired.  He says he is wobbly and has trouble walking over uneven surfaces.  His skin on his right chest and right back are red and itchy.  He has been using biafine cream.

## 2013-07-06 NOTE — Addendum Note (Signed)
Encounter addended by: Eduardo Osier, RN on: 07/06/2013 12:09 PM<BR>     Documentation filed: Inpatient MAR

## 2013-07-10 ENCOUNTER — Telehealth: Payer: Self-pay | Admitting: *Deleted

## 2013-07-10 ENCOUNTER — Other Ambulatory Visit: Payer: Self-pay | Admitting: Radiation Oncology

## 2013-07-10 MED ORDER — AZITHROMYCIN 250 MG PO TABS: ORAL_TABLET | ORAL | Status: DC

## 2013-07-10 NOTE — Telephone Encounter (Signed)
Called Jose Gordon and informed him that Dr Michell Heinrich sent a prescription for Z Pack to [facility], [address] Gave Jose Gordon phone number for pharmacy. Advised he pick up the medication today and begin today. Also informed him that this RN or Evangeline Gula for Dr Roselind Messier will call him early next week to check on his status. Jose Gordon verbalized understanding of all above.

## 2013-07-10 NOTE — Telephone Encounter (Signed)
[facility] in [facility].  Please call pt next week to see how he is doing.

## 2013-07-10 NOTE — Telephone Encounter (Signed)
Received call from pt's wife, Darcie. Pt completed treatment to his right lung on 07/06/13. She states Dr Roselind Messier gave pt Tussinex Monday for his productive cough w/clear sputum. She states yesterday his sputum became yellow to green. She states pt has no fever, no sore throat, HA, pain or other c/o, problems. She is asking if pt needs another medication due to this change in his sputum color. Informed her Dr Roselind Messier is out of office today and will discuss her concerns w/Dr Michell Heinrich and call her back. Ms Minshall [facility]. Ms Kallal verbalized understanding of plan.

## 2013-07-13 ENCOUNTER — Telehealth: Payer: Self-pay | Admitting: Oncology

## 2013-07-13 NOTE — Telephone Encounter (Signed)
Called and spoke to Jose Gordon's wife.  She said that he is better today after taking the Z pack over the weekend.  He has once more pill to take tomorrow.  She said he was very weak on Friday and Saturday.  She said he was a little confused on Saturday and insisted on going to the gym which is up the street from their house to exercise.  He ended up falling in the parking lot and she had to go get him.  She said he did not hurt himself, other than skinning his knee.  She said he has been sleeping a lot and not eating very much.  Advised her to push fluids and to have him eat whatever he wants.  She said he is still coughing but feels better.  She did not know what color sputum he is coughing up today.  Advised her to call with any issues and told her that I would call to check on [facility] tomorrow.

## 2013-07-14 ENCOUNTER — Telehealth: Payer: Self-pay | Admitting: Oncology

## 2013-07-14 NOTE — Telephone Encounter (Signed)
Called and spoke to Jose Gordon's wife.  She said he is better but still coughing.  He took his pills this morning and after started coughing and thought he might have coughed up some blood.  His pills were pink so they were not sure if if was blood or not.  Advised her that he should be evaluated by his primary care doctor.  Darcie said she was going to call this morning to get him an appointment for today.

## 2013-07-14 NOTE — Telephone Encounter (Signed)
He needs to be evaluated by his PCP today or tomorrow. SW

## 2013-07-18 ENCOUNTER — Encounter: Payer: Self-pay | Admitting: Radiation Oncology

## 2013-07-18 NOTE — Progress Notes (Signed)
  Radiation Oncology         [phone]) [phone] ________________________________  Name: Jose Gordon MRN: [id number]  Date: 07/18/2013  DOB: [birth year]  Simulation Verification Note - performed on 05/15/2013  Status: outpatient  NARRATIVE: The patient was brought to the treatment unit and placed in the planned treatment position. The clinical setup was verified. Then port films were obtained and uploaded to the radiation oncology medical record software.  The treatment beams were carefully compared against the planned radiation fields. The position location and shape of the radiation fields was reviewed. They targeted volume of tissue appears to be appropriately covered by the radiation beams. Organs at risk appear to be excluded as planned.  Based on my personal review, I approved the simulation verification. The patient's treatment will proceed as planned.  -----------------------------------  Billie Lade, PhD, MD

## 2013-08-03 ENCOUNTER — Encounter (HOSPITAL_COMMUNITY): Payer: Self-pay

## 2013-08-03 ENCOUNTER — Other Ambulatory Visit (HOSPITAL_BASED_OUTPATIENT_CLINIC_OR_DEPARTMENT_OTHER): Payer: MEDICARE | Admitting: Lab

## 2013-08-03 ENCOUNTER — Encounter (INDEPENDENT_AMBULATORY_CARE_PROVIDER_SITE_OTHER): Payer: Self-pay

## 2013-08-03 ENCOUNTER — Ambulatory Visit (HOSPITAL_COMMUNITY)
Admission: RE | Admit: 2013-08-03 | Discharge: 2013-08-03 | Disposition: A | Discharge status: Home / Self Care | Payer: MEDICARE | Source: Ambulatory Visit | Attending: Nurse Practitioner | Admitting: Nurse Practitioner

## 2013-08-03 DIAGNOSIS — C3490 Malignant neoplasm of unspecified part of unspecified bronchus or lung: Secondary | ICD-10-CM | POA: Insufficient documentation

## 2013-08-03 DIAGNOSIS — Z923 Personal history of irradiation: Secondary | ICD-10-CM | POA: Insufficient documentation

## 2013-08-03 DIAGNOSIS — I700 Atherosclerosis of aorta: Secondary | ICD-10-CM | POA: Insufficient documentation

## 2013-08-03 DIAGNOSIS — C342 Malignant neoplasm of middle lobe, bronchus or lung: Secondary | ICD-10-CM

## 2013-08-03 DIAGNOSIS — I2510 Atherosclerotic heart disease of native coronary artery without angina pectoris: Secondary | ICD-10-CM | POA: Insufficient documentation

## 2013-08-03 DIAGNOSIS — J90 Pleural effusion, not elsewhere classified: Secondary | ICD-10-CM | POA: Insufficient documentation

## 2013-08-03 DIAGNOSIS — C3410 Malignant neoplasm of upper lobe, unspecified bronchus or lung: Secondary | ICD-10-CM

## 2013-08-03 LAB — CBC WITH DIFFERENTIAL/PLATELET
BASO%: 0.4 % (ref 0.0–2.0)
Basophils Absolute: 0 10*3/uL (ref 0.0–0.1)
EOS%: 4.9 % (ref 0.0–7.0)
Eosinophils Absolute: 0.3 10*3/uL (ref 0.0–0.5)
HCT: 47.2 % (ref 38.4–49.9)
HGB: 16.2 g/dL (ref 13.0–17.1)
MCH: 27.3 pg (ref 27.2–33.4)
MCV: 79.6 fL (ref 79.3–98.0)
MONO#: 0.9 10*3/uL (ref 0.1–0.9)
MONO%: 13.3 % (ref 0.0–14.0)
NEUT#: 5 10*3/uL (ref 1.5–6.5)
NEUT%: 74.5 % (ref 39.0–75.0)
RBC: 5.93 10*6/uL — ABNORMAL HIGH (ref 4.20–5.82)
RDW: 13.5 % (ref 11.0–14.6)
lymph#: 0.5 10*3/uL — ABNORMAL LOW (ref 0.9–3.3)

## 2013-08-03 LAB — COMPREHENSIVE METABOLIC PANEL (CC13)
AST: 19 U/L (ref 5–34)
Albumin: 3.5 g/dL (ref 3.5–5.0)
Alkaline Phosphatase: 74 U/L (ref 40–150)
Anion Gap: 9 mEq/L (ref 3–11)
BUN: 16.4 mg/dL (ref 7.0–26.0)
CO2: 27 mEq/L (ref 22–29)
Calcium: 10.5 mg/dL — ABNORMAL HIGH (ref 8.4–10.4)
Chloride: 101 mEq/L (ref 98–109)
Creatinine: 1.1 mg/dL (ref 0.7–1.3)
Glucose: 119 mg/dl (ref 70–140)
Potassium: 5.4 mEq/L — ABNORMAL HIGH (ref 3.5–5.1)
Sodium: 137 mEq/L (ref 136–145)

## 2013-08-03 MED ORDER — IOHEXOL 300 MG/ML  SOLN
80.0000 mL | Freq: Once | INTRAMUSCULAR | Status: AC | PRN
  Administered 2013-08-03: 80 mL via INTRAVENOUS

## 2013-08-05 ENCOUNTER — Telehealth: Payer: Self-pay | Admitting: *Deleted

## 2013-08-05 NOTE — Telephone Encounter (Signed)
Message copied by Sabino Snipes on Wed Aug 05, 2013 12:37 PM ------      Message from: Levert Feinstein      Created: Tue Aug 04, 2013  8:45 PM       Call pt - lung tumor slowly shrinking ------

## 2013-08-05 NOTE — Telephone Encounter (Signed)
Notified pt & wife of CT report per Dr Patsy Lager request.

## 2013-08-07 ENCOUNTER — Encounter: Payer: Self-pay | Admitting: Oncology

## 2013-08-07 ENCOUNTER — Encounter (INDEPENDENT_AMBULATORY_CARE_PROVIDER_SITE_OTHER): Payer: Self-pay

## 2013-08-07 ENCOUNTER — Ambulatory Visit (HOSPITAL_BASED_OUTPATIENT_CLINIC_OR_DEPARTMENT_OTHER): Payer: MEDICARE | Admitting: Oncology

## 2013-08-07 ENCOUNTER — Telehealth: Payer: Self-pay | Admitting: Oncology

## 2013-08-07 ENCOUNTER — Ambulatory Visit: Payer: MEDICARE | Admitting: Lab

## 2013-08-07 VITALS — BP 126/74 | HR 88 | Temp 96.8°F | Resp 17 | Ht 73.0 in | Wt 232.9 lb

## 2013-08-07 DIAGNOSIS — J449 Chronic obstructive pulmonary disease, unspecified: Secondary | ICD-10-CM

## 2013-08-07 DIAGNOSIS — C3430 Malignant neoplasm of lower lobe, unspecified bronchus or lung: Secondary | ICD-10-CM

## 2013-08-07 DIAGNOSIS — C3410 Malignant neoplasm of upper lobe, unspecified bronchus or lung: Secondary | ICD-10-CM

## 2013-08-07 DIAGNOSIS — J4489 Other specified chronic obstructive pulmonary disease: Secondary | ICD-10-CM

## 2013-08-07 DIAGNOSIS — C342 Malignant neoplasm of middle lobe, bronchus or lung: Secondary | ICD-10-CM

## 2013-08-07 DIAGNOSIS — E119 Type 2 diabetes mellitus without complications: Secondary | ICD-10-CM

## 2013-08-07 NOTE — Telephone Encounter (Signed)
Gave pt appt for lab and MD for February 2015 °

## 2013-08-09 NOTE — Progress Notes (Signed)
Hematology and Oncology Follow Up Visit  Jose Gordon [id number] [birth year] 79 y.o. 08/09/2013 4:03 PM   Principle Diagnosis: Encounter Diagnoses  Name Primary?  . Lung cancer, middle lobe Yes  . Lung cancer, upper lobe, unspecified laterality   . Lung cancer, lower lobe, unspecified laterality   . COPD (chronic obstructive pulmonary disease)      Interim History:   Followup visit for this 79 year old man with metachronous primary non-small cell lung cancers. Initial right lower lobe lesion resected in 2005. Second primary upper lobe lesion resected in 2009. Course complicated by postoperative lung abscess. He recently developed a third primary tumor 3.1 x 2.6 cm in the right middle lobe with associated post obstructive pneumonia. PET scan did not show any evidence for metastatic spread. He underwent a transcutaneous needle biopsy on July 24 complicated by hemoptysis which fortunately was self-limited. Biopsy showed poorly differentiated squamous cell carcinoma. Pathology from the 2009 cancer was moderately differentiated squamous cell carcinoma and also a poorly differentiated squamous cell carcinoma in 2005. He is 84. He has developed progressive obstructive airway disease. He was not a candidate for further aggressive surgery. I referred him to radiation oncology. He just completed a course of definitive radiation 63 gray in 35 fractions between September 2 and 07/06/2013. He tolerated the treatments well. He still has an intermittent cough. No further hemoptysis.  CT scan of the chest done in anticipation of today's visit on November 17, shows a significant reduction in the tumor mass in the right middle lobe. Anticipate there will be further improvement on subsequent studies.   Medications: reviewed  Allergies: No Known Allergies  Review of Systems: Hematology:  No bleeding ENT ROS: No sore throat Breast ROS:  Respiratory ROS: Dyspnea on exertion. Chronic cough. No  hemoptysis Cardiovascular ROS:   No ischemic chest pain or pressure Gastrointestinal ROS:   No abdominal pain or change in bowel habit Genito-Urinary ROS: No urinary tract symptoms Musculoskeletal ROS: Chronic arthritis pain in his back Neurological ROS: No headache or change in vision Dermatological ROS: No rash Remaining ROS negative.  Physical Exam: Blood pressure 126/74, pulse 88, temperature 96.8 F (36 C), temperature source Oral, resp. rate 17, height 6\' 1"  (1.854 m), weight 232 lb 14.4 oz (105.643 kg). Wt Readings from Last 3 Encounters:  08/07/13 232 lb 14.4 oz (105.643 kg)  06/30/13 245 lb (111.131 kg)  06/24/13 244 lb 8 oz (110.904 kg)     General appearance: Well-nourished Caucasian man HENNT: Pharynx no erythema, exudate, mass, or ulcer. No thyromegaly or thyroid nodules Lymph nodes: No cervical, supraclavicular, or axillary lymphadenopathy Breasts: Lungs: Decreased breath sounds and rales at the right base left lung clear, resonant to percussion throughout Heart: Regular rhythm, no murmur, no gallop, no rub, no click, no edema Abdomen: Soft, nontender, normal bowel sounds, no mass, no organomegaly Extremities: No edema, no calf tenderness Musculoskeletal: no joint deformities GU:  Vascular: Carotid pulses 2+, no bruits Neurologic: Alert, oriented, PERRLA,, cranial nerves grossly normal, motor strength 5 over 5, reflexes 1+ symmetric, upper body coordination normal, gait normal, Skin: No rash or ecchymosis  Lab Results: CBC W/Diff    Component Value Date/Time   WBC 6.7 08/03/2013 0751   WBC 7.6 05/08/2013 1105   RBC 5.93* 08/03/2013 0751   RBC 4.92 05/08/2013 1105   HGB 16.2 08/03/2013 0751   HGB 14.4 05/08/2013 1105   HCT 47.2 08/03/2013 0751   HCT 41.3 05/08/2013 1105   PLT 188 08/03/2013 0751   PLT  212 05/08/2013 1105   MCV 79.6 08/03/2013 0751   MCV 83.9 05/08/2013 1105   MCH 27.3 08/03/2013 0751   MCH 29.3 05/08/2013 1105   MCHC 34.3 08/03/2013 0751    MCHC 34.9 05/08/2013 1105   RDW 13.5 08/03/2013 0751   RDW 13.6 05/08/2013 1105   LYMPHSABS 0.5* 08/03/2013 0751   LYMPHSABS 0.5* 03/07/2008 1310   MONOABS 0.9 08/03/2013 0751   MONOABS 1.5* 03/07/2008 1310   EOSABS 0.3 08/03/2013 0751   EOSABS 0.0 03/07/2008 1310   BASOSABS 0.0 08/03/2013 0751   BASOSABS 0.0 03/07/2008 1310     Chemistry      Component Value Date/Time   NA 137 08/03/2013 0751   NA 128* 04/10/2013 0330   NA 136 03/25/2012 0929   K 5.4* 08/03/2013 0751   K 4.8 04/10/2013 0330   K 5.4* 03/25/2012 0929   CL 92* 04/10/2013 0330   CL 96* 03/25/2012 0929   CO2 27 08/03/2013 0751   CO2 29 04/10/2013 0330   CO2 31 03/25/2012 0929   BUN 16.4 08/03/2013 0751   BUN 14 04/10/2013 0330   BUN 10 03/25/2012 0929   CREATININE 1.1 08/03/2013 0751   CREATININE 0.90 04/10/2013 0330   CREATININE 1.0 03/25/2012 0929      Component Value Date/Time   CALCIUM 10.5* 08/03/2013 0751   CALCIUM 9.0 04/10/2013 0330   CALCIUM 9.0 03/25/2012 0929   ALKPHOS 74 08/03/2013 0751   ALKPHOS 51 03/25/2012 0929   ALKPHOS 58 03/27/2011 0752   AST 19 08/03/2013 0751   AST 21 03/25/2012 0929   AST 22 03/27/2011 0752   ALT 19 08/03/2013 0751   ALT 20 03/25/2012 0929   ALT 14 03/27/2011 0752   BILITOT 0.89 08/03/2013 0751   BILITOT 1.20 03/25/2012 0929   BILITOT 0.7 03/27/2011 0752       Radiological Studies: Ct Chest W Contrast  08/03/2013   CLINICAL DATA:  Lung cancer status post right lung resection, XRT complete. Shortness of breath, cough, chest pain.  EXAM: CT CHEST WITH CONTRAST  TECHNIQUE: Multidetector CT imaging of the chest was performed during intravenous contrast administration.  CONTRAST:  80mL OMNIPAQUE IOHEXOL 300 MG/ML  SOLN  COMPARISON:  05/01/2013  FINDINGS: Status post right upper lobectomy and right lower lobe wedge resection.  3.3 x 1.3 cm necrotic right middle lobe mass (series 5/ image 24), corresponding to biopsy-proven lung cancer, previously 4.4 x 4.0 cm.  Loculated pleural fluid along the anterior  right upper lung (series 2/image 20), corresponding to known hematoma, decreased.  Radiation changes in the right lung. Subpleural reticulation/scarring with emphysematous changes. 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 No pneumothorax.  Visualized thyroid is unremarkable.  Heart is normal in size. No pericardial effusion. Mild coronary atherosclerosis. Atherosclerotic calcifications of the aortic arch.  9 mm short axis subcarinal node (series 2/ image 33), previously 13 mm. No suspicious hilar or axillary lymphadenopathy.  Visualized upper abdomen is unremarkable.  Degenerative changes of the visualized thoracolumbar spine. Mild loss of height of the T7 and T8 vertebral bodies, chronic.  IMPRESSION: Status post right upper lobectomy and right lower lobe wedge resection. Radiation changes in the right lung.  3.3 x 1.3 cm necrotic right middle lobe mass, corresponding to biopsy-proven lung cancer, decreased.  Loculated pleural fluid along the anterior right upper lung, corresponding to known hematoma, decreased.  9 mm short axis subcarinal node, improved.   Electronically Signed  By: Charline Bills M.D.   On: 08/03/2013 09:41    Impression: #1. Metachronous primary squamous cell carcinomas of the right lung upper lobe, lower lobe, now middle lobe. First two cancers treated with surgery. Current cancer treated with radiation. He is still standing! I will do another CT scan and clinical followup in 3 months. I will then alternate CT and chest x-rays every 6 months.  #2. Obstructive airway disease not currently oxygen dependent  #3. Essential hypertension  #4. Type 2 diabetes   CC: Patient Care Team: Gaspar Garbe, MD as PCP - General (Internal Medicine)   Levert Feinstein, MD 11/23/20144:03 PM

## 2013-08-10 ENCOUNTER — Encounter: Payer: Self-pay | Admitting: Radiation Oncology

## 2013-08-10 ENCOUNTER — Ambulatory Visit
Admission: RE | Admit: 2013-08-10 | Discharge: 2013-08-10 | Disposition: A | Discharge status: Home / Self Care | Payer: MEDICARE | Source: Ambulatory Visit | Attending: Radiation Oncology | Admitting: Radiation Oncology

## 2013-08-10 VITALS — BP 154/74 | HR 98 | Temp 97.3°F | Ht 73.0 in | Wt 233.3 lb

## 2013-08-10 DIAGNOSIS — C342 Malignant neoplasm of middle lobe, bronchus or lung: Secondary | ICD-10-CM

## 2013-08-10 NOTE — Progress Notes (Signed)
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  He continues to have a cough but this is better since completing his radiation therapy. He denies any hemoptysis. He does have some dyspnea with exertion. His major problem today is urinary retention. He has only been able to urinate small amounts over the past 3 days.  The patient did see medical oncology late last week.  A posttreatment chest CT showed shrinkage of his right lung lesion without new problems.                              ALLERGIES:  has No Known Allergies.  Meds: Current Outpatient Prescriptions  Medication Sig Dispense Refill  . albuterol (PROVENTIL) (2.5 MG/3ML) 0.083% nebulizer solution Take 2.5 mg by nebulization every 6 (six) hours as needed for wheezing.      Marland Kitchen ALPRAZolam (XANAX) 0.5 MG tablet Take 0.5 mg by mouth at bedtime.       . calcium-vitamin D (OSCAL WITH D) 500-200 MG-UNIT per tablet Take 1 tablet by mouth 2 (two) times daily.      . finasteride (PROSCAR) 5 MG tablet Take 5 mg by mouth daily.      Marland Kitchen GLUCOSAMINE-CHONDROITIN PO Take 1 tablet by mouth 2 (two) times daily. 1500/1200 mg      . lidocaine (LIDODERM) 5 % Place 1 patch onto the skin daily as needed (for knee pain). Remove & Discard patch within 12 hours or as directed by MD,      . metoprolol tartrate (LOPRESSOR) 25 MG tablet Take 25 mg by mouth 2 (two) times daily.      . mirtazapine (REMERON) 7.5 MG tablet Take 7.5 mg by mouth daily.      . montelukast (SINGULAIR) 10 MG tablet Take 10 mg by mouth daily.      . Multiple Vitamins-Minerals (ICAPS MV PO) Take 1 capsule by mouth 2 (two)  times daily. With breakfast & supper      . naproxen sodium (ANAPROX) 220 MG tablet Take 220 mg by mouth 2 (two) times daily as needed (for pain).      Marland Kitchen omeprazole (PRILOSEC) 20 MG capsule Take 20 mg by mouth daily.      Marland Kitchen Umeclidinium-Vilanterol (ANORO ELLIPTA) 62.5-25 MCG/INH AEPB Inhale into the lungs daily.       No current facility-administered medications for this encounter.    Physical Findings: The patient is in no acute distress. Patient is alert and oriented.  height is 6\' 1"  (1.854 m) and weight is 233 lb 4.8 oz (105.824 kg). His temperature is 97.3 F (36.3 C). His blood pressure is 154/74 and his pulse is 98. His oxygen saturation is 96%. .  No palpable supraclavicular or axillary adenopathy. The lungs are clear to auscultation. The heart has regular rhythm and rate.  Lab Findings: Lab Results  Component Value Date   WBC 6.7 08/03/2013   HGB 16.2 08/03/2013   HCT 47.2 08/03/2013   MCV 79.6 08/03/2013   PLT 188 08/03/2013      Radiographic Findings: Ct Chest  W Contrast  08/03/2013   CLINICAL DATA:  Lung cancer status post right lung resection, XRT complete. Shortness of breath, cough, chest pain.  EXAM: CT CHEST WITH CONTRAST  TECHNIQUE: Multidetector CT imaging of the chest was performed during intravenous contrast administration.  CONTRAST:  80mL OMNIPAQUE IOHEXOL 300 MG/ML  SOLN  COMPARISON:  05/01/2013  FINDINGS: Status post right upper lobectomy and right lower lobe wedge resection.  3.3 x 1.3 cm necrotic right middle lobe mass (series 5/ image 24), corresponding to biopsy-proven lung cancer, previously 4.4 x 4.0 cm.  Loculated pleural fluid along the anterior right upper lung (series 2/image 20), corresponding to known hematoma, decreased.  Radiation changes in the right lung. Subpleural reticulation/scarring with emphysematous changes. 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 No pneumothorax.  Visualized  thyroid is unremarkable.  Heart is normal in size. No pericardial effusion. Mild coronary atherosclerosis. Atherosclerotic calcifications of the aortic arch.  9 mm short axis subcarinal node (series 2/ image 33), previously 13 mm. No suspicious hilar or axillary lymphadenopathy.  Visualized upper abdomen is unremarkable.  Degenerative changes of the visualized thoracolumbar spine. Mild loss of height of the T7 and T8 vertebral bodies, chronic.  IMPRESSION: Status post right upper lobectomy and right lower lobe wedge resection. Radiation changes in the right lung.  3.3 x 1.3 cm necrotic right middle lobe mass, corresponding to biopsy-proven lung cancer, decreased.  Loculated pleural fluid along the anterior right upper lung, corresponding to known hematoma, decreased.  9 mm short axis subcarinal node, improved.   Electronically Signed   By: Charline Bills M.D.   On: 08/03/2013 09:41    Impression:  The patient is recovering from the effects of radiation.  Good report on most recent chest CT scan. I would expect further shrinkage over the next several months.  New-onset urinary retention  Plan:  Routine followup in radiation oncology in 3 months.  Dr. Annabell Howells has graciously agreed to see the patient on urgent basis concerning his urinary retention. The patient may require urinary catheterization.  _____________________________________ -----------------------------------  Billie Lade, PhD, MD

## 2013-08-10 NOTE — Progress Notes (Signed)
Jose Gordon here with his wife for follow up after treatment to his right lung.  He reports that he has a cough but it is better than during radiation.  He denies coughing up blood.  He does have shortness of breath with activity.  He reports not being able to urinate since Friday.  He says that he "dribbles a little bit."   He also has constipation and has not had a bowel movement since Friday.  He has started taking metamucil.  He is trying to drink fluids.  He feels discomfort in his rectal area.  He saw Dr. Cyndie Chime on Friday and has a potassium of 5.4.  His skin is intact on his right chest.  He has hyperpigmentation on his right upper back.  He is fatigued.

## 2013-08-21 ENCOUNTER — Other Ambulatory Visit: Payer: Self-pay | Admitting: *Deleted

## 2013-08-21 ENCOUNTER — Telehealth: Payer: Self-pay | Admitting: Dietician

## 2013-08-21 ENCOUNTER — Telehealth: Payer: Self-pay | Admitting: *Deleted

## 2013-08-21 MED ORDER — HYDROCOD POLST-CHLORPHEN POLST 10-8 MG/5ML PO LQCR: 5.0000 mL | Freq: Two times a day (BID) | ORAL | Status: DC | PRN

## 2013-08-21 NOTE — Telephone Encounter (Addendum)
Pt's wife called reporting that pt has consistent cough mainly at night and requesting script for Tussionex "Dr. Roselind Messier ordered in the past and that really helped"  Pt's wife reports pt has no fever/N/V; no SOB; appetite is the same (doesn't eat much) but drinks plenty of fluids.  Lonna Cobb, NP made aware.

## 2013-08-21 NOTE — Telephone Encounter (Signed)
Notified Pt's wife script for Tussionex ready to be picked-up at her convenience.  Pt's wife verbalized understanding and expressed appreciation for call back.

## 2013-08-21 NOTE — Telephone Encounter (Signed)
Brief Outpatient Oncology Nutrition Note  Patient has been identified to be at risk on malnutrition screen.  Wt Readings from Last 10 Encounters:  08/10/13 233 lb 4.8 oz (105.824 kg)  08/07/13 232 lb 14.4 oz (105.643 kg)  06/30/13 245 lb (111.131 kg)  06/24/13 244 lb 8 oz (110.904 kg)  06/23/13 245 lb 11.2 oz (111.449 kg)  06/16/13 246 lb 8 oz (111.812 kg)  06/09/13 246 lb 9.6 oz (111.857 kg)  06/02/13 246 lb (111.585 kg)  05/26/13 246 lb 9.6 oz (111.857 kg)  05/20/13 248 lb 1.6 oz (112.537 kg)   Dx:  Lung Cancer  [facility].  Called and spoke to patient's wife due to continued weight loss.  Wife reports patient with decreased appetite, intake, and taste alterations.  Patient is drinking 1-2 Ensure daily.    Will mail handouts on taste changes and decreased appetite along with coupons for Ensure and contact information for the [facility].  Oran Rein, RD, LDN

## 2013-10-02 ENCOUNTER — Telehealth: Payer: Self-pay | Admitting: *Deleted

## 2013-10-02 DIAGNOSIS — R630 Anorexia: Secondary | ICD-10-CM

## 2013-10-02 MED ORDER — MEGESTROL ACETATE 40 MG PO TABS: 80.0000 mg | ORAL_TABLET | Freq: Two times a day (BID) | ORAL | Status: DC

## 2013-10-02 NOTE — Telephone Encounter (Addendum)
Received vm call from pt's wife, Darcie asking if there is something that pt could take to increase appetite.  Note to Dr Beryle Beams.  Called pt's wife back this pm & she states that pt was given a male hormone by Dr Beryle Beams in the past & pt would like to repeat this.  Informed that script will be sent for megace 80 mg twice daily to his pharmacy.

## 2013-10-27 ENCOUNTER — Inpatient Hospital Stay (HOSPITAL_COMMUNITY)
Admission: EM | Admit: 2013-10-27 | Discharge: 2013-10-29 | DRG: 181 | Disposition: A | Discharge status: Hospice | Payer: MEDICARE | Attending: Internal Medicine | Admitting: Internal Medicine

## 2013-10-27 ENCOUNTER — Emergency Department (HOSPITAL_COMMUNITY): Payer: MEDICARE

## 2013-10-27 ENCOUNTER — Encounter (HOSPITAL_COMMUNITY): Payer: Self-pay | Admitting: Emergency Medicine

## 2013-10-27 DIAGNOSIS — Z6830 Body mass index (BMI) 30.0-30.9, adult: Secondary | ICD-10-CM

## 2013-10-27 DIAGNOSIS — Z9981 Dependence on supplemental oxygen: Secondary | ICD-10-CM

## 2013-10-27 DIAGNOSIS — K219 Gastro-esophageal reflux disease without esophagitis: Secondary | ICD-10-CM | POA: Diagnosis present

## 2013-10-27 DIAGNOSIS — E119 Type 2 diabetes mellitus without complications: Secondary | ICD-10-CM | POA: Diagnosis present

## 2013-10-27 DIAGNOSIS — E46 Unspecified protein-calorie malnutrition: Secondary | ICD-10-CM | POA: Diagnosis present

## 2013-10-27 DIAGNOSIS — C3430 Malignant neoplasm of lower lobe, unspecified bronchus or lung: Secondary | ICD-10-CM

## 2013-10-27 DIAGNOSIS — Z515 Encounter for palliative care: Secondary | ICD-10-CM

## 2013-10-27 DIAGNOSIS — J449 Chronic obstructive pulmonary disease, unspecified: Secondary | ICD-10-CM

## 2013-10-27 DIAGNOSIS — N400 Enlarged prostate without lower urinary tract symptoms: Secondary | ICD-10-CM | POA: Diagnosis present

## 2013-10-27 DIAGNOSIS — C3490 Malignant neoplasm of unspecified part of unspecified bronchus or lung: Principal | ICD-10-CM | POA: Diagnosis present

## 2013-10-27 DIAGNOSIS — R5383 Other fatigue: Secondary | ICD-10-CM

## 2013-10-27 DIAGNOSIS — F3289 Other specified depressive episodes: Secondary | ICD-10-CM | POA: Diagnosis present

## 2013-10-27 DIAGNOSIS — I10 Essential (primary) hypertension: Secondary | ICD-10-CM | POA: Diagnosis present

## 2013-10-27 DIAGNOSIS — Z902 Acquired absence of lung [part of]: Secondary | ICD-10-CM

## 2013-10-27 DIAGNOSIS — C7800 Secondary malignant neoplasm of unspecified lung: Secondary | ICD-10-CM | POA: Diagnosis present

## 2013-10-27 DIAGNOSIS — Z66 Do not resuscitate: Secondary | ICD-10-CM | POA: Diagnosis present

## 2013-10-27 DIAGNOSIS — F329 Major depressive disorder, single episode, unspecified: Secondary | ICD-10-CM | POA: Diagnosis present

## 2013-10-27 DIAGNOSIS — R627 Adult failure to thrive: Secondary | ICD-10-CM | POA: Diagnosis present

## 2013-10-27 DIAGNOSIS — R5381 Other malaise: Secondary | ICD-10-CM | POA: Diagnosis present

## 2013-10-27 DIAGNOSIS — J441 Chronic obstructive pulmonary disease with (acute) exacerbation: Secondary | ICD-10-CM | POA: Diagnosis present

## 2013-10-27 LAB — COMPREHENSIVE METABOLIC PANEL
ALT: 17 U/L (ref 0–53)
AST: 17 U/L (ref 0–37)
Albumin: 3.3 g/dL — ABNORMAL LOW (ref 3.5–5.2)
Alkaline Phosphatase: 80 U/L (ref 39–117)
BUN: 15 mg/dL (ref 6–23)
CALCIUM: 9.7 mg/dL (ref 8.4–10.5)
CO2: 22 meq/L (ref 19–32)
CREATININE: 0.84 mg/dL (ref 0.50–1.35)
Chloride: 96 mEq/L (ref 96–112)
GFR calc Af Amer: >90 mL/min (ref >90)
GFR, EST NON AFRICAN AMERICAN: 79 mL/min — AB (ref >90)
Glucose, Bld: 107 mg/dL — ABNORMAL HIGH (ref 70–99)
Potassium: 4.3 mEq/L (ref 3.7–5.3)
Sodium: 132 mEq/L — ABNORMAL LOW (ref 137–147)
Total Bilirubin: 0.8 mg/dL (ref 0.3–1.2)
Total Protein: 7.3 g/dL (ref 6.0–8.3)

## 2013-10-27 LAB — CBC WITH DIFFERENTIAL/PLATELET
BASOS ABS: 0 10*3/uL (ref 0.0–0.1)
Basophils Relative: 0 % (ref 0–1)
EOS PCT: 2 % (ref 0–5)
Eosinophils Absolute: 0.2 10*3/uL (ref 0.0–0.7)
HCT: 38.5 % — ABNORMAL LOW (ref 39.0–52.0)
Hemoglobin: 13.7 g/dL (ref 13.0–17.0)
Lymphocytes Relative: 9 % — ABNORMAL LOW (ref 12–46)
Lymphs Abs: 1.1 10*3/uL (ref 0.7–4.0)
MCH: 28.5 pg (ref 26.0–34.0)
MCHC: 35.6 g/dL (ref 30.0–36.0)
MCV: 80.2 fL (ref 78.0–100.0)
Monocytes Absolute: 1.5 10*3/uL — ABNORMAL HIGH (ref 0.1–1.0)
Monocytes Relative: 13 % — ABNORMAL HIGH (ref 3–12)
Neutro Abs: 9 10*3/uL — ABNORMAL HIGH (ref 1.7–7.7)
Neutrophils Relative %: 76 % (ref 43–77)
Platelets: 268 10*3/uL (ref 150–400)
RBC: 4.8 MIL/uL (ref 4.22–5.81)
RDW: 13.4 % (ref 11.5–15.5)
WBC: 11.7 10*3/uL — ABNORMAL HIGH (ref 4.0–10.5)

## 2013-10-27 LAB — URINALYSIS, ROUTINE W REFLEX MICROSCOPIC
Bilirubin Urine: NEGATIVE
Glucose, UA: NEGATIVE mg/dL
Hgb urine dipstick: NEGATIVE
Ketones, ur: NEGATIVE mg/dL
LEUKOCYTES UA: NEGATIVE
NITRITE: NEGATIVE
Protein, ur: NEGATIVE mg/dL
SPECIFIC GRAVITY, URINE: 1.015 (ref 1.005–1.030)
UROBILINOGEN UA: 1 mg/dL (ref 0.0–1.0)
pH: 7 (ref 5.0–8.0)

## 2013-10-27 LAB — CG4 I-STAT (LACTIC ACID): LACTIC ACID, VENOUS: 1.66 mmol/L (ref 0.5–2.2)

## 2013-10-27 LAB — POCT I-STAT TROPONIN I: Troponin i, poc: 0.03 ng/mL (ref 0.00–0.08)

## 2013-10-27 LAB — PRO B NATRIURETIC PEPTIDE: PRO B NATRI PEPTIDE: 327.4 pg/mL (ref 0–450)

## 2013-10-27 LAB — MRSA PCR SCREENING: MRSA by PCR: NEGATIVE

## 2013-10-27 MED ORDER — SODIUM CHLORIDE 0.9 % IV BOLUS (SEPSIS)
500.0000 mL | Freq: Once | INTRAVENOUS | Status: AC
Start: 2013-10-27 — End: 2013-10-27
  Administered 2013-10-27: 500 mL via INTRAVENOUS

## 2013-10-27 MED ORDER — MORPHINE SULFATE 4 MG/ML IJ SOLN
4.0000 mg | Freq: Once | INTRAMUSCULAR | Status: AC
  Administered 2013-10-27: 4 mg via INTRAVENOUS
  Filled 2013-10-27: qty 1

## 2013-10-27 MED ORDER — CEFTRIAXONE SODIUM 1 G IJ SOLR
1.0000 g | Freq: Once | INTRAMUSCULAR | Status: AC
  Administered 2013-10-27: 1 g via INTRAVENOUS
  Filled 2013-10-27: qty 10

## 2013-10-27 MED ORDER — SODIUM CHLORIDE 0.9 % IV BOLUS (SEPSIS)
500.0000 mL | Freq: Once | INTRAVENOUS | Status: AC
  Administered 2013-10-27: 500 mL via INTRAVENOUS

## 2013-10-27 MED ORDER — MORPHINE SULFATE 4 MG/ML IJ SOLN: 4.0000 mg | INTRAMUSCULAR | Status: DC | PRN

## 2013-10-27 MED ORDER — ONDANSETRON HCL 4 MG PO TABS: 4.0000 mg | ORAL_TABLET | Freq: Four times a day (QID) | ORAL | Status: DC | PRN

## 2013-10-27 MED ORDER — ENOXAPARIN SODIUM 40 MG/0.4ML ~~LOC~~ SOLN: 40.0000 mg | SUBCUTANEOUS | Status: DC

## 2013-10-27 MED ORDER — ALPRAZOLAM 0.5 MG PO TABS
0.5000 mg | ORAL_TABLET | Freq: Every day | ORAL | Status: DC
  Administered 2013-10-27 – 2013-10-28 (×2): 0.5 mg via ORAL
  Filled 2013-10-27 (×2): qty 1

## 2013-10-27 MED ORDER — ALBUTEROL (5 MG/ML) CONTINUOUS INHALATION SOLN
10.0000 mg/h | INHALATION_SOLUTION | Freq: Once | RESPIRATORY_TRACT | Status: AC
  Administered 2013-10-27: 10 mg/h via RESPIRATORY_TRACT
  Filled 2013-10-27: qty 20

## 2013-10-27 MED ORDER — CALCIUM CARBONATE-VITAMIN D 500-200 MG-UNIT PO TABS
1.0000 | ORAL_TABLET | Freq: Two times a day (BID) | ORAL | Status: DC
  Administered 2013-10-27 – 2013-10-28 (×3): 1 via ORAL
  Filled 2013-10-27 (×6): qty 1

## 2013-10-27 MED ORDER — IOHEXOL 350 MG/ML SOLN
100.0000 mL | Freq: Once | INTRAVENOUS | Status: AC | PRN
  Administered 2013-10-27: 100 mL via INTRAVENOUS

## 2013-10-27 MED ORDER — LOSARTAN POTASSIUM 50 MG PO TABS
50.0000 mg | ORAL_TABLET | Freq: Every day | ORAL | Status: DC
  Administered 2013-10-27 – 2013-10-28 (×2): 50 mg via ORAL
  Filled 2013-10-27 (×3): qty 1

## 2013-10-27 MED ORDER — PREDNISONE 20 MG PO TABS
40.0000 mg | ORAL_TABLET | Freq: Every day | ORAL | Status: DC
  Administered 2013-10-27 – 2013-10-29 (×3): 40 mg via ORAL
  Filled 2013-10-27 (×4): qty 2

## 2013-10-27 MED ORDER — FINASTERIDE 5 MG PO TABS
5.0000 mg | ORAL_TABLET | Freq: Every day | ORAL | Status: DC
  Administered 2013-10-27 – 2013-10-28 (×2): 5 mg via ORAL
  Filled 2013-10-27 (×4): qty 1

## 2013-10-27 MED ORDER — ENOXAPARIN SODIUM 60 MG/0.6ML ~~LOC~~ SOLN
50.0000 mg | SUBCUTANEOUS | Status: DC
  Administered 2013-10-27 – 2013-10-28 (×2): 50 mg via SUBCUTANEOUS
  Filled 2013-10-27 (×3): qty 0.6

## 2013-10-27 MED ORDER — SIROLIMUS 1 MG PO TABS
1.0000 mg | ORAL_TABLET | Freq: Every day | ORAL | Status: DC
  Administered 2013-10-27 – 2013-10-28 (×2): 1 mg via ORAL
  Filled 2013-10-27 (×3): qty 1

## 2013-10-27 MED ORDER — OXYCODONE HCL 5 MG PO TABS: 5.0000 mg | ORAL_TABLET | ORAL | Status: DC | PRN

## 2013-10-27 MED ORDER — ACETAMINOPHEN 650 MG RE SUPP: 650.0000 mg | Freq: Four times a day (QID) | RECTAL | Status: DC | PRN

## 2013-10-27 MED ORDER — MIRTAZAPINE 7.5 MG PO TABS
7.5000 mg | ORAL_TABLET | Freq: Two times a day (BID) | ORAL | Status: DC
  Administered 2013-10-27 – 2013-10-28 (×4): 7.5 mg via ORAL
  Filled 2013-10-27 (×6): qty 1

## 2013-10-27 MED ORDER — PANTOPRAZOLE SODIUM 40 MG PO TBEC
40.0000 mg | DELAYED_RELEASE_TABLET | Freq: Every day | ORAL | Status: DC
  Administered 2013-10-27 – 2013-10-28 (×2): 40 mg via ORAL
  Filled 2013-10-27 (×3): qty 1

## 2013-10-27 MED ORDER — ALBUTEROL (5 MG/ML) CONTINUOUS INHALATION SOLN
10.0000 mg | INHALATION_SOLUTION | RESPIRATORY_TRACT | Status: DC
  Administered 2013-10-27: 10 mg via RESPIRATORY_TRACT

## 2013-10-27 MED ORDER — POTASSIUM CHLORIDE IN NACL 20-0.9 MEQ/L-% IV SOLN
INTRAVENOUS | Status: DC
  Administered 2013-10-27: 14:00:00 via INTRAVENOUS
  Filled 2013-10-27 (×3): qty 1000

## 2013-10-27 MED ORDER — ACETAMINOPHEN 325 MG PO TABS: 650.0000 mg | ORAL_TABLET | Freq: Four times a day (QID) | ORAL | Status: DC | PRN

## 2013-10-27 MED ORDER — FENTANYL CITRATE 0.05 MG/ML IJ SOLN
50.0000 ug | Freq: Once | INTRAMUSCULAR | Status: AC
  Administered 2013-10-27: 50 ug via INTRAVENOUS
  Filled 2013-10-27: qty 2

## 2013-10-27 MED ORDER — MONTELUKAST SODIUM 10 MG PO TABS
10.0000 mg | ORAL_TABLET | Freq: Every day | ORAL | Status: DC
Start: 2013-10-27 — End: 2013-10-29
  Administered 2013-10-27 – 2013-10-28 (×2): 10 mg via ORAL
  Filled 2013-10-27 (×3): qty 1

## 2013-10-27 MED ORDER — POLYETHYLENE GLYCOL 3350 17 G PO PACK
17.0000 g | PACK | Freq: Every day | ORAL | Status: DC | PRN
  Filled 2013-10-27: qty 1

## 2013-10-27 MED ORDER — DOCUSATE SODIUM 100 MG PO CAPS
100.0000 mg | ORAL_CAPSULE | Freq: Two times a day (BID) | ORAL | Status: DC
  Administered 2013-10-27 – 2013-10-28 (×4): 100 mg via ORAL
  Filled 2013-10-27 (×6): qty 1

## 2013-10-27 MED ORDER — ASPIRIN EC 81 MG PO TBEC
81.0000 mg | DELAYED_RELEASE_TABLET | Freq: Every day | ORAL | Status: DC
  Administered 2013-10-27 – 2013-10-28 (×2): 81 mg via ORAL
  Filled 2013-10-27 (×3): qty 1

## 2013-10-27 MED ORDER — METOPROLOL TARTRATE 12.5 MG HALF TABLET
12.5000 mg | ORAL_TABLET | Freq: Two times a day (BID) | ORAL | Status: DC
  Administered 2013-10-27 – 2013-10-28 (×4): 12.5 mg via ORAL
  Filled 2013-10-27 (×6): qty 1

## 2013-10-27 MED ORDER — METHYLPREDNISOLONE SODIUM SUCC 125 MG IJ SOLR
125.0000 mg | Freq: Once | INTRAMUSCULAR | Status: AC
  Administered 2013-10-27: 125 mg via INTRAVENOUS
  Filled 2013-10-27: qty 2

## 2013-10-27 MED ORDER — ONDANSETRON HCL 4 MG/2ML IJ SOLN: 4.0000 mg | Freq: Four times a day (QID) | INTRAMUSCULAR | Status: DC | PRN

## 2013-10-27 MED ORDER — ZOLPIDEM TARTRATE 5 MG PO TABS: 5.0000 mg | ORAL_TABLET | Freq: Every evening | ORAL | Status: DC | PRN

## 2013-10-27 MED ORDER — ALBUTEROL SULFATE (2.5 MG/3ML) 0.083% IN NEBU
5.0000 mg | INHALATION_SOLUTION | Freq: Once | RESPIRATORY_TRACT | Status: AC
  Administered 2013-10-27: 5 mg via RESPIRATORY_TRACT
  Filled 2013-10-27: qty 6

## 2013-10-27 MED ORDER — DEXTROSE 5 % IV SOLN
1.0000 g | INTRAVENOUS | Status: DC
  Administered 2013-10-28: 1 g via INTRAVENOUS
  Filled 2013-10-27 (×2): qty 10

## 2013-10-27 MED ORDER — ALBUTEROL SULFATE (2.5 MG/3ML) 0.083% IN NEBU
2.5000 mg | INHALATION_SOLUTION | Freq: Four times a day (QID) | RESPIRATORY_TRACT | Status: DC | PRN
  Administered 2013-10-27: 2.5 mg via RESPIRATORY_TRACT
  Filled 2013-10-27 (×3): qty 3

## 2013-10-27 MED ORDER — ADULT MULTIVITAMIN W/MINERALS CH
1.0000 | ORAL_TABLET | Freq: Every day | ORAL | Status: DC
  Administered 2013-10-27 – 2013-10-28 (×2): 1 via ORAL
  Filled 2013-10-27 (×3): qty 1

## 2013-10-27 NOTE — ED Notes (Signed)
Patient transported to CT 

## 2013-10-27 NOTE — H&P (Signed)
PCP:   Haywood Pao, MD   Chief Complaint:  Progressive Lung CA, chest pain, SOB  HPI: Jose Gordon is an 80 year old male with a history of lung cancer with prior lobe resection who has had recurrence in the past year, managed by [facility] and underwent XRT in the fall.  He was due next week for a repeat CT of his chest which was in fact done in the ER today.  It shows progression of disease and new nodules.  [facility] was called but told the ERMD that his symptoms are not related to his cancer and requested that I admit the patient for ongoing management.  Jose Gordon wife, Jose Gordon is also my patient as well.  WE have had discussion in the recent past relating to the realty that the likelihood of meaningful cure from a recurrent lung cancer of this type is minimal.  Jose Gordon is a former smoker and was switched to Anoro per Pulmonary at a visit following his bronchoscopy in 2014 as it was thought hat the ICS component added very little to his therapy.  Other than steroids, his continued home oxygen use and the occasional burst of steroids as will be done today, we have little else to offer from a symptom improvement standpoint and he and his wife are very well aware of this.   Review of Systems:  Review of Systems - Negative except chest pain and shortness of breath on a 12 point review. Past Medical History: Past Medical History  Diagnosis Date  . COPD (chronic obstructive pulmonary disease) 04/01/2012  . HTN (hypertension), benign 04/01/2012  . History of radiation therapy 05/19/2013-07/06/2013    63 Gy to right middle lobe area  . Lung cancer, upper lobe 04/01/2012    T2N0 IB resected Jan,2005  . Lung cancer, lower lobe 04/01/2012    T1aN0M0 RLL resected June,2009  . Lung cancer, middle lobe 03/27/2013    Right side  3.1 cm 03/18/13 CT  . DM type 2 (diabetes mellitus, type 2) 04/01/2012    no meds   Past Surgical History  Procedure Laterality Date  . Prostate surgery      40 yrs ago,  no cancer  . Cataract surgery Bilateral   . Knee surgery Left     arthroscopic  . Nephrectomy      pt denies  . Lung surgery Right 2005, 2009    RLL '05, RUL 2009    Medications: Prior to Admission medications   Medication Sig Start Date End Date Taking? Authorizing Provider  albuterol (PROVENTIL) (2.5 MG/3ML) 0.083% nebulizer solution Take 2.5 mg by nebulization every 6 (six) hours as needed for wheezing.   Yes Historical Provider, MD  ALPRAZolam Duanne Moron) 0.5 MG tablet Take 0.5 mg by mouth at bedtime.    Yes Historical Provider, MD  aspirin EC 81 MG tablet Take 81 mg by mouth daily.   Yes Historical Provider, MD  calcium-vitamin D (OSCAL WITH D) 500-200 MG-UNIT per tablet Take 1 tablet by mouth 2 (two) times daily.   Yes Historical Provider, MD  finasteride (PROSCAR) 5 MG tablet Take 5 mg by mouth daily.   Yes Historical Provider, MD  GLUCOSAMINE-CHONDROITIN PO Take 1 tablet by mouth 2 (two) times daily. 1500/1200 mg   Yes Historical Provider, MD  lidocaine (LIDODERM) 5 % Place 1 patch onto the skin daily as needed (for knee pain). Remove & Discard patch within 12 hours or as directed by MD,   Yes Historical Provider, MD  losartan (COZAAR)  50 MG tablet Take 50 mg by mouth daily.   Yes Historical Provider, MD  metoprolol tartrate (LOPRESSOR) 25 MG tablet Take 12.5 mg by mouth 2 (two) times daily.    Yes Historical Provider, MD  mirtazapine (REMERON) 7.5 MG tablet Take 7.5 mg by mouth 2 (two) times daily.    Yes Historical Provider, MD  montelukast (SINGULAIR) 10 MG tablet Take 10 mg by mouth daily.   Yes Historical Provider, MD  Multiple Vitamins-Minerals (ICAPS MV PO) Take 1 capsule by mouth 2 (two) times daily. With breakfast & supper   Yes Historical Provider, MD  omeprazole (PRILOSEC) 20 MG capsule Take 20 mg by mouth daily.   Yes Historical Provider, MD  sirolimus (RAPAMUNE) 1 MG tablet Take 1 mg by mouth daily.   Yes Historical Provider, MD  Umeclidinium-Vilanterol (ANORO ELLIPTA)  62.5-25 MCG/INH AEPB Inhale into the lungs daily.   Yes Historical Provider, MD  naproxen sodium (ANAPROX) 220 MG tablet Take 220 mg by mouth 2 (two) times daily as needed (for pain).    Historical Provider, MD    Allergies:  No Known Allergies  Social History:  reports that he quit smoking about 15 years ago. His smoking use included Cigarettes. He has a 40 pack-year smoking history. He does not have any smokeless tobacco history on file. He reports that he does not drink alcohol or use illicit drugs.  Family History: Family History  Problem Relation Age of Onset  . Cancer Sister     lung  . Cancer Brother     lung    Physical Exam: Filed Vitals:   10/27/13 0916 10/27/13 1019 10/27/13 1022 10/27/13 1030  BP: 113/44 71/40 78/48  105/63  Pulse:    127  Temp:      TempSrc:      Resp: 19   35  SpO2: 94%   96%   General appearance: alert, cooperative and appears stated age Head: Normocephalic, without obvious abnormality, atraumatic Eyes: conjunctivae/corneas clear. PERRL, EOM's intact.  Nose: Nares normal. Septum midline. Mucosa normal. No drainage or sinus tenderness. Throat: lips, mucosa, and tongue normal; teeth and gums normal Neck: no adenopathy, no carotid bruit, no JVD and thyroid not enlarged, symmetric, no tenderness/mass/nodules Resp: diminished breath sounds bilaterally Cardio: regular rate and rhythm, S1, S2 normal, no murmur, click, rub or gallop GI: soft, non-tender; bowel sounds normal; no masses,  no organomegaly Extremities: extremities normal, atraumatic, no cyanosis or edema Pulses: 2+ and symmetric Lymph nodes: Cervical adenopathy: no cervical lymphadenopathy Neurologic: Alert and oriented X 3, normal strength and tone. Normal symmetric reflexes.     Labs on Admission:   Recent Labs  10/27/13 0700  NA 132*  K 4.3  CL 96  CO2 22  GLUCOSE 107*  BUN 15  CREATININE 0.84  CALCIUM 9.7    Recent Labs  10/27/13 0700  AST 17  ALT 17  ALKPHOS 80   BILITOT 0.8  PROT 7.3  ALBUMIN 3.3*    Recent Labs  10/27/13 0700  WBC 11.7*  NEUTROABS 9.0*  HGB 13.7  HCT 38.5*  MCV 80.2  PLT 268    Lab Results  Component Value Date   INR 1.03 05/08/2013   INR 1.04 04/10/2013   INR 1.01 04/09/2013    Radiological Exams on Admission: Dg Chest 2 View  10/27/2013   CLINICAL DATA:  Chest pain and shortness of breath. Cough and chest congestion. Lung cancer.  EXAM: CHEST  2 VIEW  COMPARISON:  Chest CT dated  08/03/2013  FINDINGS: There is increased volume loss in the right hemi thorax particularly in the right upper lobe. There is increased lateral pleural thickening on the right. Chronic apical pleural thickening on the right. Heart size and vascularity are normal. Linear thickening of No acute osseous abnormality. Old deformity of the to the mid thoracic vertebra. An accessory fissure at the left lung base.  IMPRESSION: Increased volume loss in the right hemi thorax without a discrete new mass or infiltrate.   Electronically Signed   By: Rozetta Nunnery M.D.   On: 10/27/2013 07:23   Ct Angio Chest Pe W/cm &/or Wo Cm  10/27/2013   CLINICAL DATA:  Chest pain, shortness of breath with deep inspiration, question pulmonary embolus, history of lung cancer  EXAM: CT ANGIOGRAPHY CHEST WITH CONTRAST  TECHNIQUE: Multidetector CT imaging of the chest was performed using the standard protocol during bolus administration of intravenous contrast. Multiplanar CT image reconstructions and MIPs were obtained to evaluate the vascular anatomy.  CONTRAST:  19mL OMNIPAQUE IOHEXOL 350 MG/ML SOLN  COMPARISON:  08/03/2013  FINDINGS: Sagittal images of the spine shows stable compression deformities mid thoracic spine. Stable degenerative changes.  Central airways are patent. The study is of excellent technical quality. No pulmonary embolus is noted.  Again noted pleural thickening and postradiation changes in right upper lobe. The patient is status post right upper lobectomy.  Stable central peribronchial thickening postsurgical changes and scarring in right hilum.  Scarring in right middle lobe is stable. 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  Atherosclerotic calcifications of thoracic aorta. Small amount of fluid and air noted in distal esophagus. No significant mediastinal adenopathy. A precarinal lymph node measures 1.2 by 1 cm stable from prior exam. No destructive rib lesions are noted. Improvement in previous right middle lobe mass/scarring measures 2 by 1.2 cm on the prior exam measures 3.3 x 1.3 cm.  Review of the MIP images confirms the above findings. Stable mild right tracheal deviation.  There is new enlarging nodular mass in left lower lobe medially with subtle peripheral enhancement highly suspicious for metastatic disease. Measures 2.5 x 2.1 cm.  Visualized upper abdomen is unremarkable. Small pericardial effusion or pericardial thickening.  IMPRESSION: 1. New nodular mass in left lower lobe medially measures 2.5 x 2.1 cm highly suspicious for metastatic disease. Correlation with PET scan and/or biopsy is recommended. 2. Stable postsurgical and postradiation changes. Status post right upper lobectomy. No pulmonary embolus. 3. Stable compression deformities mid thoracic spine. 4. No significant mediastinal adenopathy. 5. Again noted left lower lobe of pulmonary sequestration with aberrant draining vessel.   Electronically Signed   By: Lahoma Crocker M.D.   On: 10/27/2013 09:25   Orders placed during the hospital encounter of 10/27/13  . EKG 12-LEAD  . EKG 12-LEAD  . ED EKG  . ED EKG  . EKG 12-LEAD  . EKG 12-LEAD    Assessment/Plan Active Problems:   Lung cancer He has a new LLL mass which is consistent with progression of his disease.  Per prior discussions, he is not a candidate for more chemotherapy.  Unless Dr. Beryle Beams (whom I assume will be seeing the patient later today as he was contacted by the ER) feels  differently, I believe it is time to consider Hospice and end of life services.  He is clearly having a rapid decline, a progression in his disease symptoms as well as increased tumor burden.  iIll provide pain medications and a burst  of steroids as well as oxygen and nebs.  WE also discussed DNR/DNI as a possibility for him as well as it is unlikely that he will be able to come off ventilator and this is not something that he wants at this time.  Comtinue Empiric Rocephin as started in the ER as well.  Will get Palliative care to see him as well.  HTN  Controlled  BPH Continue current alpha blocker  Jose Gordon W 10/27/2013, 11:43 AM

## 2013-10-27 NOTE — ED Notes (Signed)
Pt a/o x4 at present time.

## 2013-10-27 NOTE — ED Provider Notes (Signed)
80 year old male with a history of lung cancer, has had recurrent lung cancer and has just finished a course of radiation. Presents with shortness of breath associated with a cough. Paramedics noted that he had wheezing, given albuterol, patient states that shortness of breath has been progressive. On my exam after nebulizer treatment the patient has clear lung sounds, he remains tachypneic with a mild increased work of breathing, no peripheral edema or asymmetry, mild tachycardia which appears to be in a sinus tachycardia. EKG is otherwise unchanged from 2009 when he was in A. fib in the perioperative period surrounding his lung cancer.  Patient will need further evaluation for sources such as pneumonia, CHF, pulmonary embolism.  Medical screening examination/treatment/procedure(s) were conducted as a shared visit with non-physician practitioner(s) and myself.  I personally evaluated the patient during the encounter.  Clinical Impression: Shortness of breath      Johnna Acosta, MD 10/27/13 [phone]

## 2013-10-27 NOTE — ED Provider Notes (Signed)
CSN: [id number]     Arrival date & time 10/27/13  0614 History   First MD Initiated Contact with Patient 10/27/13 [phone]     Chief Complaint  Patient presents with  . Chest Pain  . Shortness of Breath     (Consider location/radiation/quality/duration/timing/severity/associated sxs/prior Treatment) HPI Comments: Patient is an 80 year old male with history of recurrent lung cancer, COPD, hypertension, diabetes presents today with chest pain and shortness of breath. This began suddenly at 4:30 this morning. He reports chest pain is a "full wall of pain" on his anterior chest. The pain does not radiate. The shortness of breath has been gradually worsening over the past couple days. He reports it is increasingly harder to breathe when walking. His shortness of breath suddenly worsened this morning. The pain is pleuritic in nature. Patient has recently finished radiation for his tumor in October. He reports that his oncologist states that it has decreased in size and he is scheduled for a CT chest next week. Patient is a former smoker. He denies any fevers, chills, nausea, vomiting, abdominal pain, new cough.  The history is provided by the patient. No language interpreter was used.    Past Medical History  Diagnosis Date  . COPD (chronic obstructive pulmonary disease) 04/01/2012  . HTN (hypertension), benign 04/01/2012  . Lung cancer, upper lobe 04/01/2012    T2N0 IB resected Jan,2005  . Lung cancer, lower lobe 04/01/2012    T1aN0M0 RLL resected June,2009  . Lung cancer, middle lobe 03/27/2013    Right side  3.1 cm 03/18/13 CT  . DM type 2 (diabetes mellitus, type 2) 04/01/2012    no meds  . History of radiation therapy 05/19/2013-07/06/2013    63 Gy to right middle lobe area   Past Surgical History  Procedure Laterality Date  . Prostate surgery      40 yrs ago, no cancer  . Cataract surgery Bilateral   . Knee surgery Left     arthroscopic  . Nephrectomy      pt denies  . Lung surgery Right  2005, 2009    RLL '05, RUL 2009   Family History  Problem Relation Age of Onset  . Cancer Sister     lung  . Cancer Brother     lung   History  Substance Use Topics  . Smoking status: Former Smoker -- 1.00 packs/day for 40 years    Types: Cigarettes    Quit date: 09/17/1998  . Smokeless tobacco: Not on file  . Alcohol Use: No    Review of Systems  Constitutional: Negative for fever and chills.  Respiratory: Positive for cough (chronic) and shortness of breath.   Cardiovascular: Positive for chest pain. Negative for leg swelling.  Gastrointestinal: Negative for nausea, vomiting and abdominal pain.  All other systems reviewed and are negative.      Allergies  Review of patient's allergies indicates no known allergies.  Home Medications   Current Outpatient Rx  Name  Route  Sig  Dispense  Refill  . albuterol (PROVENTIL) (2.5 MG/3ML) 0.083% nebulizer solution   Nebulization   Take 2.5 mg by nebulization every 6 (six) hours as needed for wheezing.         Marland Kitchen ALPRAZolam (XANAX) 0.5 MG tablet   Oral   Take 0.5 mg by mouth at bedtime.          . calcium-vitamin D (OSCAL WITH D) 500-200 MG-UNIT per tablet   Oral   Take 1 tablet by mouth  2 (two) times daily.         . chlorpheniramine-HYDROcodone (TUSSIONEX) 10-8 MG/5ML LQCR   Oral   Take 5 mLs by mouth every 12 (twelve) hours as needed for cough.   120 mL   0   . finasteride (PROSCAR) 5 MG tablet   Oral   Take 5 mg by mouth daily.         Marland Kitchen GLUCOSAMINE-CHONDROITIN PO   Oral   Take 1 tablet by mouth 2 (two) times daily. 1500/1200 mg         . lidocaine (LIDODERM) 5 %   Transdermal   Place 1 patch onto the skin daily as needed (for knee pain). Remove & Discard patch within 12 hours or as directed by MD,         . megestrol (MEGACE) 40 MG tablet   Oral   Take 2 tablets (80 mg total) by mouth 2 (two) times daily.   60 tablet   3   . metoprolol tartrate (LOPRESSOR) 25 MG tablet   Oral   Take 25  mg by mouth 2 (two) times daily.         . mirtazapine (REMERON) 7.5 MG tablet   Oral   Take 7.5 mg by mouth daily.         . montelukast (SINGULAIR) 10 MG tablet   Oral   Take 10 mg by mouth daily.         . Multiple Vitamins-Minerals (ICAPS MV PO)   Oral   Take 1 capsule by mouth 2 (two) times daily. With breakfast & supper         . naproxen sodium (ANAPROX) 220 MG tablet   Oral   Take 220 mg by mouth 2 (two) times daily as needed (for pain).         Marland Kitchen omeprazole (PRILOSEC) 20 MG capsule   Oral   Take 20 mg by mouth daily.         Marland Kitchen Umeclidinium-Vilanterol (ANORO ELLIPTA) 62.5-25 MCG/INH AEPB   Inhalation   Inhale into the lungs daily.          BP 120/66  Pulse 109  Temp(Src) 97.6 F (36.4 C) (Oral)  Resp 29  SpO2 99% Physical Exam  Nursing note and vitals reviewed. Constitutional: He is oriented to person, place, and time. He appears well-developed and well-nourished. He appears ill (chronically). No distress.  HENT:  Head: Normocephalic and atraumatic.  Right Ear: External ear normal.  Left Ear: External ear normal.  Nose: Nose normal.  Mouth/Throat: Uvula is midline.  Eyes: Conjunctivae are normal.  Neck: Normal range of motion. No tracheal deviation present.  Cardiovascular: Normal rate, regular rhythm and normal heart sounds.   Pulmonary/Chest: No stridor. Tachypnea noted. He has wheezes. He has rhonchi.  Abdominal: Soft. He exhibits no distension. There is no tenderness.  Musculoskeletal: Normal range of motion.  Neurological: He is alert and oriented to person, place, and time.  Skin: Skin is warm and dry. He is not diaphoretic.  Psychiatric: He has a normal mood and affect. His behavior is normal.    ED Course  Procedures (including critical care time) Labs Review Labs Reviewed  CBC WITH DIFFERENTIAL - Abnormal; Notable for the following:    WBC 11.7 (*)    HCT 38.5 (*)    Neutro Abs 9.0 (*)    Lymphocytes Relative 9 (*)     Monocytes Relative 13 (*)    Monocytes Absolute 1.5 (*)  All other components within normal limits  COMPREHENSIVE METABOLIC PANEL - Abnormal; Notable for the following:    Sodium 132 (*)    Glucose, Bld 107 (*)    Albumin 3.3 (*)    GFR calc non Af Amer 79 (*)    All other components within normal limits  URINALYSIS, ROUTINE W REFLEX MICROSCOPIC  PRO B NATRIURETIC PEPTIDE  CG4 I-STAT (LACTIC ACID)  POCT I-STAT TROPONIN I   Imaging Review Dg Chest 2 View  10/27/2013   CLINICAL DATA:  Chest pain and shortness of breath. Cough and chest congestion. Lung cancer.  EXAM: CHEST  2 VIEW  COMPARISON:  Chest CT dated 08/03/2013  FINDINGS: There is increased volume loss in the right hemi thorax particularly in the right upper lobe. There is increased lateral pleural thickening on the right. Chronic apical pleural thickening on the right. Heart size and vascularity are normal. Linear thickening of No acute osseous abnormality. Old deformity of the to the mid thoracic vertebra. An accessory fissure at the left lung base.  IMPRESSION: Increased volume loss in the right hemi thorax without a discrete new mass or infiltrate.   Electronically Signed   By: Rozetta Nunnery M.D.   On: 10/27/2013 07:23   Ct Angio Chest Pe W/cm &/or Wo Cm  10/27/2013   CLINICAL DATA:  Chest pain, shortness of breath with deep inspiration, question pulmonary embolus, history of lung cancer  EXAM: CT ANGIOGRAPHY CHEST WITH CONTRAST  TECHNIQUE: Multidetector CT imaging of the chest was performed using the standard protocol during bolus administration of intravenous contrast. Multiplanar CT image reconstructions and MIPs were obtained to evaluate the vascular anatomy.  CONTRAST:  128mL OMNIPAQUE IOHEXOL 350 MG/ML SOLN  COMPARISON:  08/03/2013  FINDINGS: Sagittal images of the spine shows stable compression deformities mid thoracic spine. Stable degenerative changes.  Central airways are patent. The study is of excellent technical quality.  No pulmonary embolus is noted.  Again noted pleural thickening and postradiation changes in right upper lobe. The patient is status post right upper lobectomy. Stable central peribronchial thickening postsurgical changes and scarring in right hilum.  Scarring in right middle lobe is stable. 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  Atherosclerotic calcifications of thoracic aorta. Small amount of fluid and air noted in distal esophagus. No significant mediastinal adenopathy. A precarinal lymph node measures 1.2 by 1 cm stable from prior exam. No destructive rib lesions are noted. Improvement in previous right middle lobe mass/scarring measures 2 by 1.2 cm on the prior exam measures 3.3 x 1.3 cm.  Review of the MIP images confirms the above findings. Stable mild right tracheal deviation.  There is new enlarging nodular mass in left lower lobe medially with subtle peripheral enhancement highly suspicious for metastatic disease. Measures 2.5 x 2.1 cm.  Visualized upper abdomen is unremarkable. Small pericardial effusion or pericardial thickening.  IMPRESSION: 1. New nodular mass in left lower lobe medially measures 2.5 x 2.1 cm highly suspicious for metastatic disease. Correlation with PET scan and/or biopsy is recommended. 2. Stable postsurgical and postradiation changes. Status post right upper lobectomy. No pulmonary embolus. 3. Stable compression deformities mid thoracic spine. 4. No significant mediastinal adenopathy. 5. Again noted left lower lobe of pulmonary sequestration with aberrant draining vessel.   Electronically Signed   By: Lahoma Crocker M.D.   On: 10/27/2013 09:25    EKG Interpretation    Date/Time:  Tuesday October 27 2013 06:22:10 EST Ventricular Rate:  109 PR  Interval:  152 QRS Duration: 81 QT Interval:  329 QTC Calculation: 443 R Axis:   6 Text Interpretation:  Sinus tachycardia Multiple premature complexes, vent  Low voltage, precordial leads  Abnormal R-wave progression, early transition Abnormal ekg Since last tracing Sinus tachycardia has replaced Atrial fibrillation Confirmed by MILLER  MD, [place] ([id number]) on 10/27/2013 6:25:21 AM           9:54 AM Discussed case with Dr. Beryle Beams who does not believe the patient's symptoms are due to the new nodule.  MDM   Final diagnoses:  COPD (chronic obstructive pulmonary disease)  Lung cancer    Pt presents to ED with COPD exacerbation. A CT angio was done to rule out PE vs spread of CA. CTA shows new nodular mass in left lower lobe highly suspicious for metastatic disease. Discussed this with Dr. Beryle Beams who does not believe this is causing his symptoms and will see the patient as an outpatient in his office. Patient continues to wheeze after second continuous nebulizer and 125mg  of solumedrol. Will admit to Dr. Osborne Casco, patient's PCP for further inpatient management. Patient is DNR, DNI. Dr. Reather Converse evaluated this patient and agrees with plan. Vital signs stable for transfer to the floor. Patient / Family / Caregiver informed of clinical course, understand medical decision-making process, and agree with plan.     Elwyn Lade, PA-C 10/27/13 [phone]

## 2013-10-27 NOTE — Progress Notes (Signed)
   CARE MANAGEMENT ED NOTE 10/27/2013  Patient:  Jose Gordon, Jose Gordon   Account Number:  [id number]  Date Initiated:  10/27/2013  Documentation initiated by:  Livia Snellen  Subjective/Objective Assessment:   Patient presents to Ed with chest pain and shortness of breath     Subjective/Objective Assessment Detail:   Patient with pmhx of COPD, HTN, lung cancer, DM.  Patient pulse ox 80% to 99% patient on 2 liters HR 87-131.     Action/Plan:   Action/Plan Detail:   Palliative Care consult initiated.  Patient to be admitted   Anticipated DC Date:       Status Recommendation to Physician:   Result of Recommendation:    Other ED Services  Consult Working [place]  Other    Choice offered to / List presented to:  C-1 Patient          Status of service:  Completed, signed off  ED Comments:   ED Comments Detail:  EDCM spoke to patient at bedside.  No family at bedside currently.  [facility] provided patient with list of home hospice providers.  Patient stated, "Thank you, my wife will be looking at that."  [facility] placed home hospice list in patient's belongings bag.  EDCM explained to patient thet he has a choice of which agency he wants to chose.  Patient thankful for resources.  No further EDCM needs at this time.

## 2013-10-27 NOTE — ED Notes (Signed)
Pt to radiology at this time.

## 2013-10-27 NOTE — Progress Notes (Signed)
Patient Jose Gordon      DOB: 1928/12/26      OIP:[id number]  Received consult from Dr. Osborne Casco for home with hospice.  Discussed case with him.  Goals of care established.  Oncology to affirm goals .  No Palliative Medicine conference desired.  Will consult care management to facilitate offering choice and home with hospice when medically stable.  Will sign off please don't hesitate to consult [place] again if you need any assistance with goals or symptom management    [name] L. Lovena Le, MD MBA The Palliative Medicine Team at [facility] Phone: [phone] Pager: [phone]

## 2013-10-27 NOTE — ED Notes (Signed)
Per EMS, pt from home, had difficulty sleeping last night with c/o CP and ShOB, pt used at home Albuterol and was able to fall asleep, pt awoke this morning with increased ShOB and CP.  Pt states the CP increases with deep inspiration, noted to have a congested cough. Pt was initially rhonchi throughout, after a forceful cough pt was noted to have inspiratory and expiratory wheezing. Pt given 5mg  Albuterol neb, 324mg  Aspirin, and x1 Nitro with some relief of CP.  Pt was initially 83% on RA, placed on [place] without increase in SPO2, Pt noted to be 98% with neb treatment. Pt hx lung CA, last treatment in November.

## 2013-10-27 NOTE — ED Notes (Signed)
EDP Zavitz at bedside.

## 2013-10-27 NOTE — ED Notes (Signed)
Bed: BR83 Expected date:  Expected time:  Means of arrival:  Comments: EMS/80 yo male-substernal chest pain/lung cancer/SOB-chest pain with inspiration

## 2013-10-28 ENCOUNTER — Telehealth: Payer: Self-pay | Admitting: *Deleted

## 2013-10-28 DIAGNOSIS — R0609 Other forms of dyspnea: Secondary | ICD-10-CM

## 2013-10-28 DIAGNOSIS — C3430 Malignant neoplasm of lower lobe, unspecified bronchus or lung: Secondary | ICD-10-CM

## 2013-10-28 DIAGNOSIS — C3410 Malignant neoplasm of upper lobe, unspecified bronchus or lung: Secondary | ICD-10-CM

## 2013-10-28 DIAGNOSIS — C342 Malignant neoplasm of middle lobe, bronchus or lung: Secondary | ICD-10-CM

## 2013-10-28 DIAGNOSIS — R0989 Other specified symptoms and signs involving the circulatory and respiratory systems: Secondary | ICD-10-CM

## 2013-10-28 LAB — COMPREHENSIVE METABOLIC PANEL
ALBUMIN: 3.1 g/dL — AB (ref 3.5–5.2)
ALT: 15 U/L (ref 0–53)
AST: 16 U/L (ref 0–37)
Alkaline Phosphatase: 70 U/L (ref 39–117)
BUN: 25 mg/dL — ABNORMAL HIGH (ref 6–23)
CO2: 21 mEq/L (ref 19–32)
CREATININE: 1.1 mg/dL (ref 0.50–1.35)
Calcium: 10.2 mg/dL (ref 8.4–10.5)
Chloride: 96 mEq/L (ref 96–112)
GFR calc Af Amer: 70 mL/min — ABNORMAL LOW (ref >90)
GFR calc non Af Amer: 61 mL/min — ABNORMAL LOW (ref >90)
Glucose, Bld: 173 mg/dL — ABNORMAL HIGH (ref 70–99)
Potassium: 5.9 mEq/L — ABNORMAL HIGH (ref 3.7–5.3)
Sodium: 131 mEq/L — ABNORMAL LOW (ref 137–147)
Total Bilirubin: 0.6 mg/dL (ref 0.3–1.2)
Total Protein: 7.3 g/dL (ref 6.0–8.3)

## 2013-10-28 LAB — CBC
HCT: 37.1 % — ABNORMAL LOW (ref 39.0–52.0)
Hemoglobin: 12.7 g/dL — ABNORMAL LOW (ref 13.0–17.0)
MCH: 28.1 pg (ref 26.0–34.0)
MCHC: 34.2 g/dL (ref 30.0–36.0)
MCV: 82.1 fL (ref 78.0–100.0)
Platelets: 237 10*3/uL (ref 150–400)
RBC: 4.52 MIL/uL (ref 4.22–5.81)
RDW: 13.9 % (ref 11.5–15.5)
WBC: 20 10*3/uL — ABNORMAL HIGH (ref 4.0–10.5)

## 2013-10-28 MED ORDER — GUAIFENESIN-DM 100-10 MG/5ML PO SYRP: 5.0000 mL | ORAL_SOLUTION | ORAL | Status: DC | PRN

## 2013-10-28 MED ORDER — OXYCODONE HCL 5 MG/5ML PO SOLN: 10.0000 mg | ORAL | Status: DC | PRN

## 2013-10-28 NOTE — Progress Notes (Signed)
Subjective: Breathing more comfortably today.  Only having pain with coughing or deep breathing at this point.  Wife Darcie at bedside.  We reviewed his poor prognosis.  She is familiar with Hospice due to her mom's needs not too long ago and wants [facility] as [facility] provided her mom's care.  We also discussed DME needs and other than oxygen at this point, she feels that she has everything that she needs for now to care for Charlie at home.  Objective: Vital signs in last 24 hours: Temp:  [97.4 F (36.3 C)-98 F (36.7 C)] 97.6 F (36.4 C) (02/11 0555) Pulse Rate:  [87-131] 96 (02/11 0555) Resp:  [13-35] 22 (02/11 0555) BP: (71-154)/(40-111) 152/81 mmHg (02/11 0555) SpO2:  [91 %-100 %] 100 % (02/11 0555) Weight:  [103.8 kg (228 lb 13.4 oz)] 103.8 kg (228 lb 13.4 oz) (02/10 1630) Weight change:  Last BM Date: 10/26/13  Intake/Output from previous day: 02/10 0701 - 02/11 0700 In: 703.8 [I.V.:703.8] Out: 400 [Urine:400] Intake/Output this shift:   General appearance: alert, cooperative and appears stated age  Head: Normocephalic, without obvious abnormality, atraumatic  Throat: lips, mucosa, and tongue normal; teeth and gums normal  Neck: no adenopathy, no carotid bruit, no JVD and thyroid not enlarged, symmetric, no tenderness/mass/nodules  Resp: diminished breath sounds bilaterally  Cardio: regular rate and rhythm, S1, S2 normal, no murmur, click, rub or gallop  GI: soft, non-tender; bowel sounds normal; no masses, no organomegaly  Extremities: extremities normal, atraumatic, no cyanosis or edema  Pulses: 2+ and symmetric  Neurologic: Alert and oriented X 3, normal strength and tone. Normal symmetric reflexes   Lab Results:  Recent Labs  10/27/13 0700 10/28/13 0420  WBC 11.7* 20.0*  HGB 13.7 12.7*  HCT 38.5* 37.1*  PLT 268 237   BMET  Recent Labs  10/27/13 0700 10/28/13 0420  NA 132* 131*  K 4.3 5.9*  CL 96 96  CO2 22 21  GLUCOSE  107* 173*  BUN 15 25*  CREATININE 0.84 1.10  CALCIUM 9.7 10.2    Studies/Results: Dg Chest 2 View  10/27/2013   CLINICAL DATA:  Chest pain and shortness of breath. Cough and chest congestion. Lung cancer.  EXAM: CHEST  2 VIEW  COMPARISON:  Chest CT dated 08/03/2013  FINDINGS: There is increased volume loss in the right hemi thorax particularly in the right upper lobe. There is increased lateral pleural thickening on the right. Chronic apical pleural thickening on the right. Heart size and vascularity are normal. Linear thickening of No acute osseous abnormality. Old deformity of the to the mid thoracic vertebra. An accessory fissure at the left lung base.  IMPRESSION: Increased volume loss in the right hemi thorax without a discrete new mass or infiltrate.   Electronically Signed   By: Rozetta Nunnery M.D.   On: 10/27/2013 07:23   Ct Angio Chest Pe W/cm &/or Wo Cm  10/27/2013   CLINICAL DATA:  Chest pain, shortness of breath with deep inspiration, question pulmonary embolus, history of lung cancer  EXAM: CT ANGIOGRAPHY CHEST WITH CONTRAST  TECHNIQUE: Multidetector CT imaging of the chest was performed using the standard protocol during bolus administration of intravenous contrast. Multiplanar CT image reconstructions and MIPs were obtained to evaluate the vascular anatomy.  CONTRAST:  170mL OMNIPAQUE IOHEXOL 350 MG/ML SOLN  COMPARISON:  08/03/2013  FINDINGS: Sagittal images of the spine shows stable compression deformities mid thoracic spine. Stable degenerative changes.  Central airways are patent.  The study is of excellent technical quality. No pulmonary embolus is noted.  Again noted pleural thickening and postradiation changes in right upper lobe. The patient is status post right upper lobectomy. Stable central peribronchial thickening postsurgical changes and scarring in right hilum.  Scarring in right middle lobe is stable. Stable left lobe lower lobe pulmonary sequestration with aberrant draining  vessel.  Atherosclerotic calcifications of thoracic aorta. Small amount of fluid and air noted in distal esophagus. No significant mediastinal adenopathy. A precarinal lymph node measures 1.2 by 1 cm stable from prior exam. No destructive rib lesions are noted. Improvement in previous right middle lobe mass/scarring measures 2 by 1.2 cm on the prior exam measures 3.3 x 1.3 cm.  Review of the MIP images confirms the above findings. Stable mild right tracheal deviation.  There is new enlarging nodular mass in left lower lobe medially with subtle peripheral enhancement highly suspicious for metastatic disease. Measures 2.5 x 2.1 cm.  Visualized upper abdomen is unremarkable. Small pericardial effusion or pericardial thickening.  IMPRESSION: 1. New nodular mass in left lower lobe medially measures 2.5 x 2.1 cm highly suspicious for metastatic disease. Correlation with PET scan and/or biopsy is recommended. 2. Stable postsurgical and postradiation changes. Status post right upper lobectomy. No pulmonary embolus. 3. Stable compression deformities mid thoracic spine. 4. No significant mediastinal adenopathy. 5. Again noted left lower lobe of pulmonary sequestration with aberrant draining vessel.   Electronically Signed   By: Lahoma Crocker M.D.   On: 10/27/2013 09:25    Medications:  I have reviewed the patient's current medications. Scheduled: . ALPRAZolam  0.5 mg Oral QHS  . aspirin EC  81 mg Oral Daily  . calcium-vitamin D  1 tablet Oral BID  . cefTRIAXone (ROCEPHIN)  IV  1 g Intravenous Q24H  . docusate sodium  100 mg Oral BID  . enoxaparin (LOVENOX) injection  50 mg Subcutaneous Q24H  . finasteride  5 mg Oral Daily  . losartan  50 mg Oral Daily  . metoprolol tartrate  12.5 mg Oral BID  . mirtazapine  7.5 mg Oral BID  . montelukast  10 mg Oral Daily  . multivitamin with minerals  1 tablet Oral Daily  . pantoprazole  40 mg Oral Daily  . predniSONE  40 mg Oral Q breakfast  . sirolimus  1 mg Oral Daily    Continuous:  YTK:ZSWFUXNATFTDD, acetaminophen, albuterol, guaiFENesin-dextromethorphan, morphine injection, ondansetron (ZOFRAN) IV, ondansetron, oxyCODONE, oxyCODONE, polyethylene glycol, zolpidem  Assessment/Plan: Lung Cancer- Reviewed the new LLL metastatic disease with [place] by phone yesterday and he is in agreement that there is no course of further treatment and backs the idea of Hospice at this point.  Both the patient and his wife are comfortable with this as well.  He is mostly concerned with the cough and requests Robitussin, which may not be terribly effective, but will give him a measure of control over his own care.  Continue nebs and steroids (noted increased WBC due to them) and watch his breathing one more day with Rocephin prior to returning home to give him the best chance of doing as well as he possibly can.  COPD- As above.  Clearly the pain component is not COPD and likely due more to the cancer than anything.  Will try to control with liquid oxycodone which will be easier for Darcie and Hospice to administer at home.  He notes no anxiety component at this point.   BPH- On meds, no voiding issue.  GERD- Pantoprazole  HTN- Controlled on current meds.  Depression- Doing ok so far, may change when her returns home however.  Disposition:  Home with [facility]osp[facility] and [facility] providing needs.  Social work/team is to contact them and make arrangements today so that he can go home tomorrow.  Will need home oxygen as well.  LOS: 1 day   Jameyah Fennewald W 10/28/2013, 7:35 AM

## 2013-10-28 NOTE — Care Management Note (Signed)
Cm spoke with patient's spouse concerning dc planning with hospice 10/29/13. Per spouse pt and family request wheelchair and BSC. MD orders entered. [facility] dme rep [place] made aware. Pt's spouse to provide transportation home. Pt's spouse informed arrangement for home required prior to discharge.    Venita Lick Shaman Muscarella,MSN,RN [phone]

## 2013-10-28 NOTE — Progress Notes (Signed)
Pleasant 80 year old man well-known to me. He has history of metachronous primary  non-small cell lung cancers. Previous surgical resection of right upper lobe lesion in 8466 complicated by development of a empyema and right lower lobe lesion resected in 2005. New right middle lobe lesion found on a routine followup study in July 2014. Poorly differentiated squamous cell histology on biopsy complicated by gross hemoptysis requiring overnight hospitalization. He received a course of radiation between September 2 and 07/06/2013. This appeared to be a third primary tumor so he received full dose radiation.  He has oxygen-dependent obstructive airway disease which has progressed over time. He presented with increasing respiratory distress to the emergency department yesterday. CT angiogram did not show any evidence for acute pulmonary emboli. Incidentally noted was a new approximate 2.5 cm left lower lung lesion which is suspicious for a another focus of cancer. There has been near complete regression of the recently radiated right lung lesion with residual scarring.  I discussed his situation with his primary internist by phone yesterday. His age and comorbid medical conditions prohibit any reasonable additional treatment for his now fourth primary lung cancer versus metastasis. He is not a surgical candidate. He has limited residual lung function therefore he is not a candidate for further radiation treatments. The potential toxicities of chemotherapy and limited results exceed any potential small benefit. I discussed this with the patient this evening.  I concur with hospice/palliative care services to assist him and his wife.  Please let know if I can be of any further assistance.

## 2013-10-28 NOTE — Evaluation (Signed)
Physical Therapy Evaluation Patient Details Name: Jose Gordon MRN: [id number] DOB: 25-Dec-1928 Today's Date: 10/28/2013 Time: 2725-3664 PT Time Calculation (min): 25 min  PT Assessment / Plan / Recommendation History of Present Illness  80 yo male admited with new LLL mass. Hx of lung cancer, lobe resection, COPD, HTN, DM  Clinical Impression  On eval, pt required Min assist for mobility-able to ambulate a total of 60 feet but pt required seated rest break after ~30 feet. Demonstrates general weakness, decreased activity tolerance, and impaired gait and balance. Discussed need for pt to use walker for ambulation to improve steadiness. Recommend wheelchair for long distances and 3n1. Noted pt to d/c home with hospice care so not sure if HHPT is an option.     PT Assessment  Patient needs continued PT services    Follow Up Recommendations  Home health PT (if possible. Noted pt to d/c home with hospice services)    Does the patient have the potential to tolerate intense rehabilitation      Barriers to Discharge        Equipment Recommendations  Wheelchair ;Wheelchair cushion;3in1    Recommendations for Other Services     Frequency Min 3X/week    Precautions / Restrictions Precautions Precautions: Fall Restrictions Weight Bearing Restrictions: No   Pertinent Vitals/Pain 100% 3L at rest; 105 bpm 96% 3L with activity; 116 bpm      Mobility  Bed Mobility General bed mobility comments: pt sitting in recliner Transfers Overall transfer level: Needs assistance Transfers: Sit to/from Stand Sit to Stand: Min guard General transfer comment: close guard for safety Ambulation/Gait Ambulation/Gait assistance: Min assist Ambulation Distance (Feet): 30 Feet (x2) Assistive device: None Gait Pattern/deviations: Decreased stride length;Step-through pattern Gait velocity: dyspnea with activity 3/4. remained on 3L O2 throughout-sats 96% with short ambulation distance. fatigues  fairly easily. seated rest break needed General Gait Details: dyspnea with activity 3/4. remained on 3L O2 throughout-sats 96% with short ambulation distance. fatigues fairly easily. seated rest break needed. pt reaching out for objects in environment to hold onto. 2 instances of instability requiring Min assist to prevent LOB/fall.     Exercises     PT Diagnosis: Difficulty walking;Generalized weakness  PT Problem List: Decreased strength;Decreased activity tolerance;Decreased balance;Decreased mobility;Decreased knowledge of use of DME;Cardiopulmonary status limiting activity PT Treatment Interventions: DME instruction;Gait training;Functional mobility training;Therapeutic activities;Therapeutic exercise;Balance training;Patient/family education     PT Goals(Current goals can be found in the care plan section) Acute Rehab PT Goals Patient Stated Goal: home soon PT Goal Formulation: With patient/family Time For Goal Achievement: 11/11/13 Potential to Achieve Goals: Good  Visit Information  Last PT Received On: 10/28/13 Assistance Needed: +1 History of Present Illness: 80 yo male admited with new LLL mass. Hx of lung cancer, lobe resection, COPD, HTN, DM       Prior Functioning  Home Living Family/patient expects to be discharged to:: Private residence Living Arrangements: Spouse/significant other Available Help at Discharge: Family Type of Home: House Home Access: Stairs to enter [employer] of Steps: 3-back; 2-front Entrance Stairs-Rails: Right Home Layout: One level Home Equipment: Environmental consultant - 2 wheels;Shower seat Prior Function Level of Independence: Independent Comments: SOB with activity has been limiting factor Communication Communication: No difficulties    Cognition  Cognition Arousal/Alertness: Awake/alert Behavior During Therapy: WFL for tasks assessed/performed Overall Cognitive Status: Within Functional Limits for tasks assessed    Extremity/Trunk  Assessment Upper Extremity Assessment Upper Extremity Assessment: Generalized weakness Lower Extremity Assessment Lower Extremity Assessment: Generalized  weakness Cervical / Trunk Assessment Cervical / Trunk Assessment: Normal   Balance Balance Overall balance assessment: Needs assistance;History of Falls Sitting-balance support: No upper extremity supported;Feet supported Sitting balance-Leahy Scale: Good Standing balance support: No upper extremity supported;During functional activity Standing balance-Leahy Scale: Poor  End of Session PT - End of Session Equipment Utilized During Treatment: Gait belt Activity Tolerance: Patient limited by fatigue (Limited by SOB) Patient left: in chair;with call bell/phone within reach;with chair alarm set;with family/visitor present  GP     Weston Anna, MPT Pager: [phone]

## 2013-10-28 NOTE — Progress Notes (Signed)
Notified by [facility] Racer office has referred pt currently in [name]; patient and family request services of [facility]) after discharge.  Spoke with pt's wife via phone to initiate education related to hospice services, philosophy and team approach to care- wife shared she is very Patent attorney of and familiar with [facility] services as her mother recently passed at [name] . Wife shared this is very difficult for both of them but that they have decided on comfort measures only at this time.  Wife plans to return to the hospital tomorrow morning and asks for this writer to speak with both she & her husband at that time  Per discussion plan will be for discharge by personal vehicle when medically ready to d/c  -pt will need an O2 travel tank brought to the room to use at d/c *Please send completed GOLD DNR form home with pt   DME has been requested for delivery to the home later today: Complete Oxygen Package B- which includes humidifier and nebulizer machine; pt currently on 3L [place] continuous; additional equipment requested is a 3n1 BSC and light wt w/c *Pt will need a travel O2 tank brought to the room prior to d/c  *Please contact pt's wife Jose Gordon at [phone] to arrange DME delivery  [facility] information and contact numbers will be given to wife tomorrow.   Above information shared with [name] and she has contacted [name] representative to order DME Please call with any questions or concerns   Danton Sewer, RN 10/28/2013, 6:01 PM [facility] RN Liaison [phone]

## 2013-10-28 NOTE — Progress Notes (Signed)
[facility]  [facility] is providing the following services: Hospice Pkg B, w/c and commode  If patient discharges after hours, please call [phone].   Linward Headland 10/28/2013, 5:11 PM

## 2013-10-28 NOTE — Telephone Encounter (Signed)
Received call from pt's wife, Orland Jarred, stating pt is in the hospital @ [facility], [id number].  She states his cancer is back & in the left lung & he has had scans done in the hospital.  She would like to cancel upcoming scans & MD appt.  Discussed with Dr Beryle Beams & he is in agreement.  Appts will be cancelled.

## 2013-10-29 MED ORDER — OXYCODONE HCL 5 MG/5ML PO SOLN: 10.0000 mg | ORAL | Status: AC | PRN

## 2013-10-29 MED ORDER — PREDNISONE 20 MG PO TABS: 40.0000 mg | ORAL_TABLET | Freq: Every day | ORAL | Status: AC

## 2013-10-29 MED ORDER — DSS 100 MG PO CAPS: 100.0000 mg | ORAL_CAPSULE | Freq: Two times a day (BID) | ORAL | Status: AC

## 2013-10-29 NOTE — Progress Notes (Signed)
Pt had an uneventful night. Slept sitting up in chair as pt reports the bed is uncomfortable. No complaints. Vwilliams,rn.

## 2013-10-29 NOTE — Discharge Summary (Signed)
DISCHARGE SUMMARY  XENG KUCHER  MR#: [id number]  DOB:September 01, 1929  Date of Admission: 10/27/2013 Date of Discharge: 10/29/2013  Attending Physician:Sebastian Dzik W  Patient's NWG:[id number],QMVHQIO W, MD  Consults:Treatment Team:  Annia Belt, MD  Discharge Diagnoses: Active Problems:   Lung cancer, end stage  COPD with emphysema, exacerbation BPH HTN Depression Protein calorie malnutrition Failure to thrive Generalized weakness  Discharge Medications:   Medication List    STOP taking these medications       GLUCOSAMINE-CHONDROITIN PO      TAKE these medications       albuterol (2.5 MG/3ML) 0.083% nebulizer solution  Commonly known as:  PROVENTIL  Take 2.5 mg by nebulization every 6 (six) hours as needed for wheezing.     ALPRAZolam 0.5 MG tablet  Commonly known as:  XANAX  Take 0.5 mg by mouth at bedtime.     ANORO ELLIPTA 62.5-25 MCG/INH Aepb  Generic drug:  Umeclidinium-Vilanterol  Inhale into the lungs daily.     aspirin EC 81 MG tablet  Take 81 mg by mouth daily.     calcium-vitamin D 500-200 MG-UNIT per tablet  Commonly known as:  OSCAL WITH D  Take 1 tablet by mouth 2 (two) times daily.     DSS 100 MG Caps  Take 100 mg by mouth 2 (two) times daily.     finasteride 5 MG tablet  Commonly known as:  PROSCAR  Take 5 mg by mouth daily.     ICAPS MV PO  Take 1 capsule by mouth 2 (two) times daily. With breakfast & supper     lidocaine 5 %  Commonly known as:  LIDODERM  Place 1 patch onto the skin daily as needed (for knee pain). Remove & Discard patch within 12 hours or as directed by MD,     losartan 50 MG tablet  Commonly known as:  COZAAR  Take 50 mg by mouth daily.     metoprolol tartrate 25 MG tablet  Commonly known as:  LOPRESSOR  Take 12.5 mg by mouth 2 (two) times daily.     mirtazapine 7.5 MG tablet  Commonly known as:  REMERON  Take 7.5 mg by mouth 2 (two) times daily.     montelukast 10 MG tablet  Commonly known  as:  SINGULAIR  Take 10 mg by mouth daily.     naproxen sodium 220 MG tablet  Commonly known as:  ANAPROX  Take 220 mg by mouth 2 (two) times daily as needed (for pain).     omeprazole 20 MG capsule  Commonly known as:  PRILOSEC  Take 20 mg by mouth daily.     oxyCODONE 5 MG/5ML solution  Commonly known as:  ROXICODONE  Take 10 mLs (10 mg total) by mouth every 2 (two) hours as needed for moderate pain, severe pain or breakthrough pain.     predniSONE 20 MG tablet  Commonly known as:  DELTASONE  Take 2 tablets (40 mg total) by mouth daily with breakfast.     RAPAMUNE 1 MG tablet  Generic drug:  sirolimus  Take 1 mg by mouth daily.        Hospital Procedures: Dg Chest 2 View  10/27/2013   CLINICAL DATA:  Chest pain and shortness of breath. Cough and chest congestion. Lung cancer.  EXAM: CHEST  2 VIEW  COMPARISON:  Chest CT dated 08/03/2013  FINDINGS: There is increased volume loss in the right hemi thorax particularly in the right upper lobe. There is  increased lateral pleural thickening on the right. Chronic apical pleural thickening on the right. Heart size and vascularity are normal. Linear thickening of No acute osseous abnormality. Old deformity of the to the mid thoracic vertebra. An accessory fissure at the left lung base.  IMPRESSION: Increased volume loss in the right hemi thorax without a discrete new mass or infiltrate.   Electronically Signed   By: Rozetta Nunnery M.D.   On: 10/27/2013 07:23   Ct Angio Chest Pe W/cm &/or Wo Cm  10/27/2013   CLINICAL DATA:  Chest pain, shortness of breath with deep inspiration, question pulmonary embolus, history of lung cancer  EXAM: CT ANGIOGRAPHY CHEST WITH CONTRAST  TECHNIQUE: Multidetector CT imaging of the chest was performed using the standard protocol during bolus administration of intravenous contrast. Multiplanar CT image reconstructions and MIPs were obtained to evaluate the vascular anatomy.  CONTRAST:  140mL OMNIPAQUE IOHEXOL 350  MG/ML SOLN  COMPARISON:  08/03/2013  FINDINGS: Sagittal images of the spine shows stable compression deformities mid thoracic spine. Stable degenerative changes.  Central airways are patent. The study is of excellent technical quality. No pulmonary embolus is noted.  Again noted pleural thickening and postradiation changes in right upper lobe. The patient is status post right upper lobectomy. Stable central peribronchial thickening postsurgical changes and scarring in right hilum.  Scarring in right middle lobe is stable. 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  Atherosclerotic calcifications of thoracic aorta. Small amount of fluid and air noted in distal esophagus. No significant mediastinal adenopathy. A precarinal lymph node measures 1.2 by 1 cm stable from prior exam. No destructive rib lesions are noted. Improvement in previous right middle lobe mass/scarring measures 2 by 1.2 cm on the prior exam measures 3.3 x 1.3 cm.  Review of the MIP images confirms the above findings. Stable mild right tracheal deviation.  There is new enlarging nodular mass in left lower lobe medially with subtle peripheral enhancement highly suspicious for metastatic disease. Measures 2.5 x 2.1 cm.  Visualized upper abdomen is unremarkable. Small pericardial effusion or pericardial thickening.  IMPRESSION: 1. New nodular mass in left lower lobe medially measures 2.5 x 2.1 cm highly suspicious for metastatic disease. Correlation with PET scan and/or biopsy is recommended. 2. Stable postsurgical and postradiation changes. Status post right upper lobectomy. No pulmonary embolus. 3. Stable compression deformities mid thoracic spine. 4. No significant mediastinal adenopathy. 5. Again noted left lower lobe of pulmonary sequestration with aberrant draining vessel.   Electronically Signed   By: Lahoma Crocker M.D.   On: 10/27/2013 09:25    History of Present Illness: Mr. Blasius is an 80 year old  male with known recurrent CA of the lung who came to the ER 1 week before his scheduled staging CT with chest pain and shortness of breath.  Hospital Course: Eduard Clos and Marily Lente, his wife, were seen in the ER by myself and we reviewed his CT showing new LLL disease consistent with continued progression of his lung cancer.  At that time he was having pain and in distressed breathing.  We reviewed that he had no further options for oncologic treatment, which they had been made aware of per Dr. Beryle Beams.  He was started on steroids, IV Rocephin, oxygen and nebs, as well as treating his pain with Morphine.  On Day 2 he was breathing somewhat better and his pain meds were converted to liquid oxycodone.  Hospice referral was made after discussion and the initial DNR  in the ER as well.  Reviewed on Day 3 for discharge as his home packs had been delivered as well as his home oxygen.  He was comfortable and eating breakfast.  He is aware that he is not to drive or leave the house without his wife except for safety concerns.  They are comfortable with end of life care and his wife has been through Hospice care with her mom in the not so distant past.  He was discharged home today.  Day of Discharge Exam BP 103/72  Pulse 107  Temp(Src) 97.5 F (36.4 C) (Oral)  Resp 22  Ht 6\' 1"  (1.854 m)  Wt 103.8 kg (228 lb 13.4 oz)  BMI 30.20 kg/m2  SpO2 99%  Physical Exam: General appearance: alert, cooperative and appears stated age  Head: Normocephalic, without obvious abnormality, atraumatic  Throat: lips, mucosa, and tongue normal; teeth and gums normal  Neck: no adenopathy, no carotid bruit, no JVD and thyroid not enlarged, symmetric, no tenderness/mass/nodules  Resp: diminished breath sounds bilaterally  Cardio: regular rate and rhythm, S1, S2 normal, no murmur, click, rub or gallop  GI: soft, non-tender; bowel sounds normal; no masses, no organomegaly  Extremities: extremities normal, atraumatic, no cyanosis  or edema  Pulses: 2+ and symmetric  Neurologic: Alert and oriented X 3, normal strength and tone. Normal symmetric reflexes     Discharge Labs:  Recent Labs  10/27/13 0700 10/28/13 0420  NA 132* 131*  K 4.3 5.9*  CL 96 96  CO2 22 21  GLUCOSE 107* 173*  BUN 15 25*  CREATININE 0.84 1.10  CALCIUM 9.7 10.2    Recent Labs  10/27/13 0700 10/28/13 0420  AST 17 16  ALT 17 15  ALKPHOS 80 70  BILITOT 0.8 0.6  PROT 7.3 7.3  ALBUMIN 3.3* 3.1*    Recent Labs  10/27/13 0700 10/28/13 0420  WBC 11.7* 20.0*  NEUTROABS 9.0*  --   HGB 13.7 12.7*  HCT 38.5* 37.1*  MCV 80.2 82.1  PLT 268 237   Lab Results  Component Value Date   INR 1.03 05/08/2013   INR 1.04 04/10/2013   INR 1.01 04/09/2013    Discharge instructions:  Home with home Hospice, [facility]  Disposition: Home  Condition on Discharge: Stable  Total time spent on discharge:  60 minutes  Signed: Aftyn Nott W 10/29/2013, 8:35 AM

## 2013-11-01 NOTE — ED Provider Notes (Signed)
80 year old male with a history of lung cancer, has had recurrent lung cancer and has just finished a course of radiation. Presents with shortness of breath associated with a cough. Paramedics noted that he had wheezing, given albuterol, patient states that shortness of breath has been progressive. On my exam after nebulizer treatment the patient has clear lung sounds, he remains tachypneic with a mild increased work of breathing, no peripheral edema or asymmetry, mild tachycardia which appears to be in a sinus tachycardia. EKG is otherwise unchanged from 2009 when he was in A. fib in the perioperative period surrounding his lung cancer.  Patient will need further evaluation for sources such as pneumonia, CHF, pulmonary embolism.   Medical screening examination/treatment/procedure(s) were conducted as a shared visit with non-physician practitioner(s) and myself. I personally evaluated the patient during the encounter.   Clinical Impression: Shortness of breath   Johnna Acosta, MD 11/01/13 [phone]

## 2013-11-03 ENCOUNTER — Ambulatory Visit (HOSPITAL_COMMUNITY): Payer: MEDICARE

## 2013-11-03 ENCOUNTER — Other Ambulatory Visit: Payer: MEDICARE

## 2013-11-10 ENCOUNTER — Ambulatory Visit: Payer: MEDICARE | Admitting: Oncology

## 2014-01-15 DEATH — deceased

## 2014-08-06 ENCOUNTER — Ambulatory Visit: Payer: MEDICARE | Admitting: Oncology

## 2014-08-06 ENCOUNTER — Other Ambulatory Visit: Payer: MEDICARE
